# Patient Record
Sex: Female | Born: 1970
Health system: Southern US, Community
[De-identification: ages and names within clinical notes are randomized; demographics above are authoritative.]

## PROBLEM LIST (undated history)

## (undated) DIAGNOSIS — K8020 Calculus of gallbladder without cholecystitis without obstruction: Secondary | ICD-10-CM

## (undated) DIAGNOSIS — N809 Endometriosis, unspecified: Secondary | ICD-10-CM

## (undated) DIAGNOSIS — G9050 Complex regional pain syndrome I, unspecified: Secondary | ICD-10-CM

## (undated) DIAGNOSIS — T8859XA Other complications of anesthesia, initial encounter: Secondary | ICD-10-CM

## (undated) DIAGNOSIS — E785 Hyperlipidemia, unspecified: Secondary | ICD-10-CM

## (undated) DIAGNOSIS — D6861 Antiphospholipid syndrome: Secondary | ICD-10-CM

## (undated) DIAGNOSIS — E079 Disorder of thyroid, unspecified: Secondary | ICD-10-CM

## (undated) DIAGNOSIS — K589 Irritable bowel syndrome without diarrhea: Secondary | ICD-10-CM

## (undated) DIAGNOSIS — E039 Hypothyroidism, unspecified: Secondary | ICD-10-CM

## (undated) DIAGNOSIS — T4145XA Adverse effect of unspecified anesthetic, initial encounter: Secondary | ICD-10-CM

## (undated) DIAGNOSIS — N96 Recurrent pregnancy loss: Secondary | ICD-10-CM

## (undated) HISTORY — PX: ANKLE SURGERY: SHX546

## (undated) HISTORY — PX: WISDOM TOOTH EXTRACTION: SHX21

## (undated) HISTORY — PX: UPPER GI ENDOSCOPY: SHX6162

## (undated) HISTORY — PX: COLONOSCOPY: SHX174

## (undated) HISTORY — DX: Calculus of gallbladder without cholecystitis without obstruction: K80.20

## (undated) HISTORY — DX: Antiphospholipid syndrome: D68.61

---

## 1998-06-10 DIAGNOSIS — D6861 Antiphospholipid syndrome: Secondary | ICD-10-CM

## 1998-06-10 HISTORY — PX: ECTOPIC PREGNANCY SURGERY: SHX613

## 1998-06-10 HISTORY — DX: Antiphospholipid syndrome: D68.61

## 2014-06-10 DIAGNOSIS — K8020 Calculus of gallbladder without cholecystitis without obstruction: Secondary | ICD-10-CM

## 2014-06-10 HISTORY — DX: Calculus of gallbladder without cholecystitis without obstruction: K80.20

## 2015-04-19 ENCOUNTER — Emergency Department (HOSPITAL_COMMUNITY)
Admission: EM | Admit: 2015-04-19 | Discharge: 2015-04-19 | Disposition: A | Discharge status: Home / Self Care | Payer: BLUE CROSS/BLUE SHIELD | Attending: Emergency Medicine | Admitting: Emergency Medicine

## 2015-04-19 ENCOUNTER — Emergency Department (HOSPITAL_COMMUNITY): Payer: BLUE CROSS/BLUE SHIELD

## 2015-04-19 ENCOUNTER — Encounter (HOSPITAL_COMMUNITY): Payer: Self-pay

## 2015-04-19 DIAGNOSIS — K8020 Calculus of gallbladder without cholecystitis without obstruction: Secondary | ICD-10-CM | POA: Diagnosis not present

## 2015-04-19 DIAGNOSIS — Z79899 Other long term (current) drug therapy: Secondary | ICD-10-CM | POA: Insufficient documentation

## 2015-04-19 DIAGNOSIS — Z3202 Encounter for pregnancy test, result negative: Secondary | ICD-10-CM | POA: Diagnosis not present

## 2015-04-19 DIAGNOSIS — E079 Disorder of thyroid, unspecified: Secondary | ICD-10-CM | POA: Diagnosis not present

## 2015-04-19 DIAGNOSIS — R109 Unspecified abdominal pain: Secondary | ICD-10-CM | POA: Diagnosis present

## 2015-04-19 DIAGNOSIS — K5732 Diverticulitis of large intestine without perforation or abscess without bleeding: Secondary | ICD-10-CM | POA: Insufficient documentation

## 2015-04-19 HISTORY — DX: Irritable bowel syndrome, unspecified: K58.9

## 2015-04-19 HISTORY — DX: Disorder of thyroid, unspecified: E07.9

## 2015-04-19 LAB — CBC
HEMATOCRIT: 43.3 % (ref 36.0–46.0)
Hemoglobin: 14.5 g/dL (ref 12.0–15.0)
MCH: 31.6 pg (ref 26.0–34.0)
MCHC: 33.5 g/dL (ref 30.0–36.0)
MCV: 94.3 fL (ref 78.0–100.0)
PLATELETS: 215 10*3/uL (ref 150–400)
RBC: 4.59 MIL/uL (ref 3.87–5.11)
RDW: 12.6 % (ref 11.5–15.5)
WBC: 9.1 10*3/uL (ref 4.0–10.5)

## 2015-04-19 LAB — URINALYSIS, ROUTINE W REFLEX MICROSCOPIC
Glucose, UA: NEGATIVE mg/dL
HGB URINE DIPSTICK: NEGATIVE
NITRITE: NEGATIVE
PROTEIN: 30 mg/dL — AB
Specific Gravity, Urine: 1.026 (ref 1.005–1.030)
UROBILINOGEN UA: 1 mg/dL (ref 0.0–1.0)
pH: 5.5 (ref 5.0–8.0)

## 2015-04-19 LAB — COMPREHENSIVE METABOLIC PANEL
ALBUMIN: 3.7 g/dL (ref 3.5–5.0)
ALK PHOS: 54 U/L (ref 38–126)
ALT: 20 U/L (ref 14–54)
ANION GAP: 13 (ref 5–15)
AST: 20 U/L (ref 15–41)
BILIRUBIN TOTAL: 2 mg/dL — AB (ref 0.3–1.2)
BUN: 7 mg/dL (ref 6–20)
CALCIUM: 9.3 mg/dL (ref 8.9–10.3)
CO2: 26 mmol/L (ref 22–32)
Chloride: 99 mmol/L — ABNORMAL LOW (ref 101–111)
Creatinine, Ser: 0.75 mg/dL (ref 0.44–1.00)
GFR calc Af Amer: >60 mL/min (ref >60)
GLUCOSE: 119 mg/dL — AB (ref 65–99)
POTASSIUM: 3.5 mmol/L (ref 3.5–5.1)
Sodium: 138 mmol/L (ref 135–145)
TOTAL PROTEIN: 7.3 g/dL (ref 6.5–8.1)

## 2015-04-19 LAB — URINE MICROSCOPIC-ADD ON

## 2015-04-19 LAB — LIPASE, BLOOD: Lipase: 24 U/L (ref 11–51)

## 2015-04-19 LAB — POC URINE PREG, ED: PREG TEST UR: NEGATIVE

## 2015-04-19 MED ORDER — METRONIDAZOLE 500 MG PO TABS: 500.0000 mg | ORAL_TABLET | Freq: Two times a day (BID) | ORAL | Status: DC

## 2015-04-19 MED ORDER — ONDANSETRON HCL 8 MG PO TABS: 8.0000 mg | ORAL_TABLET | Freq: Three times a day (TID) | ORAL | Status: DC | PRN

## 2015-04-19 MED ORDER — CIPROFLOXACIN HCL 500 MG PO TABS: 500.0000 mg | ORAL_TABLET | Freq: Two times a day (BID) | ORAL | Status: DC

## 2015-04-19 MED ORDER — IOHEXOL 300 MG/ML  SOLN
100.0000 mL | Freq: Once | INTRAMUSCULAR | Status: AC | PRN
  Administered 2015-04-19: 100 mL via INTRAVENOUS

## 2015-04-19 NOTE — ED Notes (Signed)
Doctor at bedside.

## 2015-04-19 NOTE — ED Notes (Signed)
Pt transported to CT ?

## 2015-04-19 NOTE — ED Notes (Signed)
Generalized abdominal pain, feels like it is going to burst.  Last BM was Sunday, diarheea, pt. Denies any blood in stool Pt. Reports that alsmost always n/v.  Pt. Reported having heart palpations this morning,  She denies any chest pain.

## 2015-04-19 NOTE — ED Provider Notes (Signed)
CSN: [id number]     Arrival date & time 04/19/15  0865 History   First MD Initiated Contact with Patient 04/19/15 [phone]     Chief Complaint  Patient presents with  . Abdominal Pain     (Consider location/radiation/quality/duration/timing/severity/associated sxs/prior Treatment) HPI    Judith Long is a 44 y.o. female who presents for evaluation of abdominal pain. Pain started several days ago. She also has a sensation of bloating and inability to have a bowel movement. Her last bowel movement was 4 days ago, and was "diarrhea". She's been nauseated but not vomiting. There's been no blood in stool. She has a history of "IBS" diagnosed clinically. She has never had endoscopy. She is new in [place]. She denies fever, chills, cough, shortness of breath, weakness or dizziness. There are no other known modifying factors.    Past Medical History  Diagnosis Date  . IBS (irritable bowel syndrome)     [place] in [place]  . Thyroid disease    Past Surgical History  Procedure Laterality Date  . Ectopic pregnancy surgery    . Ankle surgery      ligaments repaired   No family history on file. Social History  Substance Use Topics  . Smoking status: Never Smoker   . Smokeless tobacco: None  . Alcohol Use: Yes     Comment: daily   OB History    No data available     Review of Systems  All other systems reviewed and are negative.     Allergies  Aspirin  Home Medications   Prior to Admission medications   Medication Sig Start Date End Date Taking? Authorizing Provider  cetirizine (ZYRTEC) 10 MG tablet Take 5 mg by mouth daily.    Yes Historical Provider, MD  Cholecalciferol (VITAMIN D) 2000 UNITS tablet Take 2,000 Units by mouth daily.   Yes Historical Provider, MD  dicyclomine (BENTYL) 10 MG capsule Take 10 mg by mouth 3 (three) times daily as needed for spasms.    Yes Historical Provider, MD  levothyroxine (SYNTHROID, LEVOTHROID) 100 MCG tablet Take 100 mcg by  mouth daily before breakfast.   Yes Historical Provider, MD  psyllium (HYDROCIL/METAMUCIL) 95 % PACK Take 1 packet by mouth daily as needed for mild constipation.    Yes Historical Provider, MD   BP 128/91 mmHg  Pulse 91  Temp(Src) 98.2 F (36.8 C) (Oral)  Resp 16  Ht  (1.549 m)  Wt 159 lb 2 oz (72.179 kg)  BMI 30.08 kg/m2  SpO2 97%  LMP 04/06/2015 Physical Exam  Constitutional: She is oriented to person, place, and time. She appears well-developed.  Overweight  HENT:  Head: Normocephalic and atraumatic.  Right Ear: External ear normal.  Left Ear: External ear normal.  Eyes: Conjunctivae and EOM are normal. Pupils are equal, round, and reactive to light.  Neck: Normal range of motion and phonation normal. Neck supple.  Cardiovascular: Normal rate, regular rhythm and normal heart sounds.   Pulmonary/Chest: Effort normal and breath sounds normal. She exhibits no bony tenderness.  Abdominal: Soft. She exhibits no mass. There is tenderness (Diffuse, mild). There is no rebound and no guarding.  Musculoskeletal: Normal range of motion.  Neurological: She is alert and oriented to person, place, and time. No cranial nerve deficit or sensory deficit. She exhibits normal muscle tone. Coordination normal.  Skin: Skin is warm, dry and intact.  Psychiatric: She has a normal mood and affect. Her behavior is normal. Judgment and thought content normal.  Nursing note and vitals reviewed.   ED Course  Procedures (including critical care time)  Medications - No data to display  Patient Vitals for the past 24 hrs:  BP Temp Temp src Pulse Resp SpO2 Height Weight  04/19/15 0930 128/91 mmHg - - 91 16 97 % - -  04/19/15 0842 160/98 mmHg 98.2 F (36.8 C) Oral 106 20 100 % 5\' 1"  (1.549 m) 159 lb 2 oz (72.179 kg)    At D/C Reevaluation with update and discussion. After initial assessment and treatment, an updated evaluation reveals she is comfortable. Discussed findings, answered questions.  Elva Breaker L    Labs Review Labs Reviewed  COMPREHENSIVE METABOLIC PANEL - Abnormal; Notable for the following:    Chloride 99 (*)    Glucose, Bld 119 (*)    Total Bilirubin 2.0 (*)    All other components within normal limits  URINALYSIS, ROUTINE W REFLEX MICROSCOPIC (NOT AT [facility]) - Abnormal; Notable for the following:    Color, Urine AMBER (*)    APPearance CLOUDY (*)    Bilirubin Urine MODERATE (*)    Ketones, ur >80 (*)    Protein, ur 30 (*)    Leukocytes, UA TRACE (*)    All other components within normal limits  URINE MICROSCOPIC-ADD ON - Abnormal; Notable for the following:    Squamous Epithelial / LPF FEW (*)    Bacteria, UA MANY (*)    All other components within normal limits  LIPASE, BLOOD  CBC  POC URINE PREG, ED    Imaging Review No results found. I have personally reviewed and evaluated these images and lab results as part of my medical decision-making.   EKG Interpretation   Date/Time:  Wednesday April 19 2015 08:58:44 EST Ventricular Rate:  97 PR Interval:  138 QRS Duration: 82 QT Interval:  352 QTC Calculation: 447 R Axis:   70 Text Interpretation:  Normal sinus rhythm Nonspecific T wave abnormality  Abnormal ECG No old tracing to compare Confirmed by [facility]  MD, Tishara Pizano  [phone] on 04/19/2015 9:56:52 AM      MDM   Final diagnoses:  Diverticulitis of large intestine without perforation or abscess without bleeding  Calculus of gallbladder without cholecystitis without obstruction    Abdominal pain d/t mild diverticulitis. Incidental gall stones. Doubt SBI, metabolic instability, or sepsis.  Nursing Notes Reviewed/ Care Coordinated Applicable Imaging Reviewed Interpretation of Laboratory Data incorporated into ED treatment  The patient appears reasonably screened and/or stabilized for discharge and I doubt any other medical condition or other [facility] requiring further screening, evaluation, or treatment in the ED at this time prior to  discharge.  Plan: Home Medications- Cipro, Flagyl, Zofran; Home Treatments- rest; return here if the recommended treatment, does not improve the symptoms; Recommended follow up- GI 2-3 weeks     Mancel BaleElliott Charnelle Bergeman, MD 04/20/15 1920

## 2015-04-19 NOTE — Discharge Instructions (Signed)
Cholelithiasis Cholelithiasis (also called gallstones) is a form of gallbladder disease in which gallstones form in your gallbladder. The gallbladder is an organ that stores bile made in the liver, which helps digest fats. Gallstones begin as small crystals and slowly grow into stones. Gallstone pain occurs when the gallbladder spasms and a gallstone is blocking the duct. Pain can also occur when a stone passes out of the duct.  RISK FACTORS Being female.  Having multiple pregnancies. Health care providers sometimes advise removing diseased gallbladders before future pregnancies.  Being obese. Eating a diet heavy in fried foods and fat.  Being older than 60 years and increasing age.  Prolonged use of medicines containing female hormones.  Having diabetes mellitus.  Rapidly losing weight.  Having a family history of gallstones (heredity).  SYMPTOMS Nausea.  Vomiting. Abdominal pain.  Yellowing of the skin (jaundice).  Sudden pain. It may persist from several minutes to several hours. Fever.  Tenderness to the touch. In some cases, when gallstones do not move into the bile duct, people have no pain or symptoms. These are called "silent" gallstones.  TREATMENT Silent gallstones do not need treatment. In severe cases, emergency surgery may be required. Options for treatment include: Surgery to remove the gallbladder. This is the most common treatment. Medicines. These do not always work and may take 6-12 months or more to work. Shock wave treatment (extracorporeal biliary lithotripsy). In this treatment an ultrasound machine sends shock waves to the gallbladder to break gallstones into smaller pieces that can pass into the intestines or be dissolved by medicine. HOME CARE INSTRUCTIONS  Only take over-the-counter or prescription medicines for pain, discomfort, or fever as directed by your health care provider.  Follow a low-fat diet until seen again by your health care provider.  Fat causes the gallbladder to contract, which can result in pain.  Follow up with your health care provider as directed. Attacks are almost always recurrent and surgery is usually required for permanent treatment.  SEEK IMMEDIATE MEDICAL CARE IF:  Your pain increases and is not controlled by medicines.  You have a fever or persistent symptoms for more than 2-3 days.  You have a fever and your symptoms suddenly get worse.  You have persistent nausea and vomiting.  MAKE SURE YOU:  Understand these instructions. Will watch your condition. Will get help right away if you are not doing well or get worse.   This information is not intended to replace advice given to you by your health care provider. Make sure you discuss any questions you have with your health care provider.   Document Released: 05/23/2005 Document Revised: 01/27/2013 Document Reviewed: 11/18/2012 Elsevier Interactive Patient Education 2016 [employer]  Diverticulitis Diverticulitis is inflammation or infection of small pouches in your colon that form when you have a condition called diverticulosis. The pouches in your colon are called diverticula. Your colon, or large intestine, is where water is absorbed and stool is formed. Complications of diverticulitis can include: Bleeding. Severe infection. Severe pain. Perforation of your colon. Obstruction of your colon. CAUSES  Diverticulitis is caused by bacteria. Diverticulitis happens when stool becomes trapped in diverticula. This allows bacteria to grow in the diverticula, which can lead to inflammation and infection. RISK FACTORS People with diverticulosis are at risk for diverticulitis. Eating a diet that does not include enough fiber from fruits and vegetables may make diverticulitis more likely to develop. SYMPTOMS  Symptoms of diverticulitis may include: Abdominal pain and tenderness. The pain is normally located  on the left side of the abdomen, but may occur  in other areas. Fever and chills. Bloating. Cramping. Nausea. Vomiting. Constipation. Diarrhea. Blood in your stool. DIAGNOSIS  Your health care provider will ask you about your medical history and do a physical exam. You may need to have tests done because many medical conditions can cause the same symptoms as diverticulitis. Tests may include: Blood tests. Urine tests. Imaging tests of the abdomen, including X-rays and CT scans. When your condition is under control, your health care provider may recommend that you have a colonoscopy. A colonoscopy can show how severe your diverticula are and whether something else is causing your symptoms. TREATMENT  Most cases of diverticulitis are mild and can be treated at home. Treatment may include: Taking over-the-counter pain medicines. Following a clear liquid diet. Taking antibiotic medicines by mouth for 7-10 days. More severe cases may be treated at a hospital. Treatment may include: Not eating or drinking. Taking prescription pain medicine. Receiving antibiotic medicines through an IV tube. Receiving fluids and nutrition through an IV tube. Surgery. HOME CARE INSTRUCTIONS  Follow your health care provider's instructions carefully. Follow a full liquid diet or other diet as directed by your health care provider. After your symptoms improve, your health care provider may tell you to change your diet. He or she may recommend you eat a high-fiber diet. Fruits and vegetables are good sources of fiber. Fiber makes it easier to pass stool. Take fiber supplements or probiotics as directed by your health care provider. Only take medicines as directed by your health care provider. Keep all your follow-up appointments. SEEK MEDICAL CARE IF:  Your pain does not improve. You have a hard time eating food. Your bowel movements do not return to normal. SEEK IMMEDIATE MEDICAL CARE IF:  Your pain becomes worse. Your symptoms do not get better. Your  symptoms suddenly get worse. You have a fever. You have repeated vomiting. You have bloody or black, tarry stools. MAKE SURE YOU:  Understand these instructions. Will watch your condition. Will get help right away if you are not doing well or get worse.   This information is not intended to replace advice given to you by your health care provider. Make sure you discuss any questions you have with your health care provider.   Document Released: 03/06/2005 Document Revised: 06/01/2013 Document Reviewed: 04/21/2013 Elsevier Interactive Patient Education [employer]

## 2015-06-07 ENCOUNTER — Ambulatory Visit: Payer: BLUE CROSS/BLUE SHIELD | Admitting: Internal Medicine

## 2016-10-04 DIAGNOSIS — R202 Paresthesia of skin: Secondary | ICD-10-CM | POA: Diagnosis not present

## 2016-10-04 DIAGNOSIS — R5383 Other fatigue: Secondary | ICD-10-CM | POA: Diagnosis not present

## 2016-10-04 DIAGNOSIS — E559 Vitamin D deficiency, unspecified: Secondary | ICD-10-CM | POA: Diagnosis not present

## 2016-10-04 DIAGNOSIS — E039 Hypothyroidism, unspecified: Secondary | ICD-10-CM | POA: Diagnosis not present

## 2016-10-09 DIAGNOSIS — K580 Irritable bowel syndrome with diarrhea: Secondary | ICD-10-CM | POA: Diagnosis not present

## 2016-10-09 DIAGNOSIS — R197 Diarrhea, unspecified: Secondary | ICD-10-CM | POA: Diagnosis not present

## 2016-10-09 DIAGNOSIS — R1084 Generalized abdominal pain: Secondary | ICD-10-CM | POA: Diagnosis not present

## 2016-10-09 DIAGNOSIS — Z800 Family history of malignant neoplasm of digestive organs: Secondary | ICD-10-CM | POA: Diagnosis not present

## 2016-10-24 DIAGNOSIS — K319 Disease of stomach and duodenum, unspecified: Secondary | ICD-10-CM | POA: Diagnosis not present

## 2016-10-24 DIAGNOSIS — R1084 Generalized abdominal pain: Secondary | ICD-10-CM | POA: Diagnosis not present

## 2016-10-24 DIAGNOSIS — K640 First degree hemorrhoids: Secondary | ICD-10-CM | POA: Diagnosis not present

## 2016-10-24 DIAGNOSIS — R197 Diarrhea, unspecified: Secondary | ICD-10-CM | POA: Diagnosis not present

## 2016-10-24 DIAGNOSIS — K3189 Other diseases of stomach and duodenum: Secondary | ICD-10-CM | POA: Diagnosis not present

## 2016-10-24 DIAGNOSIS — Z800 Family history of malignant neoplasm of digestive organs: Secondary | ICD-10-CM | POA: Diagnosis not present

## 2016-10-29 DIAGNOSIS — K319 Disease of stomach and duodenum, unspecified: Secondary | ICD-10-CM | POA: Diagnosis not present

## 2017-01-10 ENCOUNTER — Ambulatory Visit (INDEPENDENT_AMBULATORY_CARE_PROVIDER_SITE_OTHER): Payer: BLUE CROSS/BLUE SHIELD | Admitting: Obstetrics & Gynecology

## 2017-01-10 ENCOUNTER — Encounter: Payer: Self-pay | Admitting: Obstetrics & Gynecology

## 2017-01-10 ENCOUNTER — Other Ambulatory Visit (HOSPITAL_COMMUNITY)
Admission: RE | Admit: 2017-01-10 | Discharge: 2017-01-10 | Disposition: A | Discharge status: Home / Self Care | Payer: BLUE CROSS/BLUE SHIELD | Source: Ambulatory Visit | Attending: Obstetrics & Gynecology | Admitting: Obstetrics & Gynecology

## 2017-01-10 VITALS — BP 132/86 | HR 86 | Resp 14 | Ht 60.5 in | Wt 157.1 lb

## 2017-01-10 DIAGNOSIS — N938 Other specified abnormal uterine and vaginal bleeding: Secondary | ICD-10-CM | POA: Insufficient documentation

## 2017-01-10 DIAGNOSIS — K582 Mixed irritable bowel syndrome: Secondary | ICD-10-CM | POA: Diagnosis not present

## 2017-01-10 DIAGNOSIS — R102 Pelvic and perineal pain: Secondary | ICD-10-CM

## 2017-01-10 DIAGNOSIS — Z124 Encounter for screening for malignant neoplasm of cervix: Secondary | ICD-10-CM | POA: Insufficient documentation

## 2017-01-10 DIAGNOSIS — E079 Disorder of thyroid, unspecified: Secondary | ICD-10-CM | POA: Insufficient documentation

## 2017-01-10 DIAGNOSIS — R109 Unspecified abdominal pain: Secondary | ICD-10-CM | POA: Diagnosis not present

## 2017-01-10 DIAGNOSIS — Z01419 Encounter for gynecological examination (general) (routine) without abnormal findings: Secondary | ICD-10-CM | POA: Diagnosis not present

## 2017-01-10 DIAGNOSIS — N96 Recurrent pregnancy loss: Secondary | ICD-10-CM | POA: Diagnosis not present

## 2017-01-10 NOTE — Progress Notes (Signed)
46 y.o. [id number] Married Caucasian F here for annual exam.  Here for new patient appointment.  Have lived all over during the past 10 years--[place], [place].  Used to see Dr. Billy Coastaavon about 10 years ago.  Getting reestablished here.    Long hx of fertility issues.  Had 9 miscarriages.  H/O two ectopic pregnancies.  First one was treated with MTX and one with laparoscopy in [place], 2000.  Reports her progesterone levels have always been "low" and this was thought to be the cause of her miscarriages.  Took clomid off and on for several cycles.    Cycles are every 21-23 days.  Flow lasts about four days with three days being heavy.  Does have cramping and clotting occurs during these three days.    Having a lot of bloating associated with nausea and back pain.  Has seen GI this year, [facility].  Has colonoscopy and endoscopy in May.  Both were negative.  Has constipation and diarrhea, alternately.  Has been diagnosed with IBS.     Has some hormonal testing done recently.  Endocrinologist tested about five years ago.    Patient's last menstrual period was 01/01/2017.          Sexually active: Yes.    The current method of family planning is rhythm method.    Exercising: No.  The patient does not participate in regular exercise at present. Smoker:  no  Health Maintenance: Pap:  04/26/15 done with PCP  History of abnormal Pap:  no MMG:  never [facility]  [facility], IBS BMD:   never TDaP:  2015  Pneumonia vaccine(s):  never Zostavax:   never Hep C testing: not indicated  Screening Labs: PCP, Hb today: PCP   reports that she has never smoked. She has never used smokeless tobacco. She reports that she drinks alcohol. She reports that she does not use drugs.  Past Medical History:  Diagnosis Date  . IBS (irritable bowel syndrome)    [place]  . Thyroid disease     Past Surgical History:  Procedure Laterality Date  . ANKLE SURGERY     ligaments repaired  . ECTOPIC  PREGNANCY SURGERY    . WISDOM TOOTH EXTRACTION      Current Outpatient Prescriptions  Medication Sig Dispense Refill  . cetirizine (ZYRTEC) 10 MG tablet Take 5 mg by mouth as needed.     . Cholecalciferol (VITAMIN D) 2000 UNITS tablet Take 2,000 Units by mouth daily.    Marland Kitchen. dicyclomine (BENTYL) 10 MG capsule Take 10 mg by mouth 3 (three) times daily as needed for spasms.     Marland Kitchen. levothyroxine (SYNTHROID, LEVOTHROID) 100 MCG tablet Take 100 mcg by mouth daily before breakfast.    . psyllium (HYDROCIL/METAMUCIL) 95 % PACK Take 1 packet by mouth daily as needed for mild constipation.      No current facility-administered medications for this visit.     Family History  Problem Relation Age of Onset  . Addison's disease Mother   . Colon cancer Father   . Diabetes Maternal Grandmother     ROS:  Pertinent items are noted in HPI.  Otherwise, a comprehensive ROS was negative.  Exam:   BP 132/86 (BP Location: Left Arm, Patient Position: Sitting, Cuff Size: Normal)   Pulse 86   Resp 14   Ht 5' 0.5" (1.537 m)   Wt 157 lb 1.6 oz (71.3 kg)   LMP 01/01/2017   BMI 30.18 kg/m  Height: 5' 0.5" (153.7 cm)  Ht Readings from Last 3 Encounters:  01/10/17 5' 0.5" (1.537 m)  04/19/15 5\' 1"  (1.549 m)    General appearance: alert, cooperative and appears stated age Head: Normocephalic, without obvious abnormality, atraumatic Neck: no adenopathy, supple, symmetrical, trachea midline and thyroid normal to inspection and palpation Lungs: clear to auscultation bilaterally Breasts: normal appearance, no masses or tenderness Heart: regular rate and rhythm Abdomen: soft, non-tender; bowel sounds normal; no masses,  no organomegaly Extremities: extremities normal, atraumatic, no cyanosis or edema Skin: Skin color, texture, turgor normal. No rashes or lesions Lymph nodes: Cervical, supraclavicular, and axillary nodes normal. No abnormal inguinal nodes palpated Neurologic: Grossly normal   Pelvic:  External genitalia:  no lesions              Urethra:  normal appearing urethra with no masses, tenderness or lesions              Bartholins and Skenes: normal                 Vagina: normal appearing vagina with normal color and discharge, no lesions              Cervix: no lesions              Pap taken: Yes.   Bimanual Exam:  Uterus:  normal size, contour, position, consistency, mobility, non-tender              Adnexa: normal adnexa and no mass, fullness, tenderness               Rectovaginal: Confirms               Anus:  normal sphincter tone, no lesions  Chaperone was present for exam.  A:  Well Woman with normal exam Family hx of colon cancer in father, aged 44 IBS Abdominal pain H/o recurrent miscarriages and ectopic pregnancies x 2 (no living children)  P:   Mammogram guidelines reviewed.  Information given regarding  pap smear and HR HPV obtained today AMH will be obtained Will schedule PUS as well Release of lab work from Dr. Lenise ArenaMeyers and colonoscopy/encoscopy from Dr Bosie Closschooler requested today return annually or prn

## 2017-01-13 LAB — CYTOLOGY - PAP
DIAGNOSIS: NEGATIVE
HPV (WINDOPATH): NOT DETECTED

## 2017-01-13 LAB — ANTI MULLERIAN HORMONE: ANTI-MULLERIAN HORMONE (AMH): 4.66 ng/mL

## 2017-01-15 ENCOUNTER — Other Ambulatory Visit: Payer: Self-pay | Admitting: Obstetrics & Gynecology

## 2017-01-15 DIAGNOSIS — Z1231 Encounter for screening mammogram for malignant neoplasm of breast: Secondary | ICD-10-CM

## 2017-01-16 ENCOUNTER — Ambulatory Visit (INDEPENDENT_AMBULATORY_CARE_PROVIDER_SITE_OTHER): Payer: BLUE CROSS/BLUE SHIELD | Admitting: Obstetrics & Gynecology

## 2017-01-16 ENCOUNTER — Ambulatory Visit (INDEPENDENT_AMBULATORY_CARE_PROVIDER_SITE_OTHER): Payer: BLUE CROSS/BLUE SHIELD

## 2017-01-16 ENCOUNTER — Other Ambulatory Visit: Payer: Self-pay | Admitting: Obstetrics & Gynecology

## 2017-01-16 ENCOUNTER — Encounter: Payer: Self-pay | Admitting: Obstetrics & Gynecology

## 2017-01-16 VITALS — BP 122/68 | HR 76 | Resp 16 | Wt 157.0 lb

## 2017-01-16 DIAGNOSIS — N96 Recurrent pregnancy loss: Secondary | ICD-10-CM

## 2017-01-16 DIAGNOSIS — N938 Other specified abnormal uterine and vaginal bleeding: Secondary | ICD-10-CM | POA: Diagnosis not present

## 2017-01-16 DIAGNOSIS — R102 Pelvic and perineal pain: Secondary | ICD-10-CM | POA: Diagnosis not present

## 2017-01-16 DIAGNOSIS — E282 Polycystic ovarian syndrome: Secondary | ICD-10-CM

## 2017-01-16 DIAGNOSIS — R109 Unspecified abdominal pain: Secondary | ICD-10-CM

## 2017-01-16 MED ORDER — IBUPROFEN 800 MG PO TABS: 800.0000 mg | ORAL_TABLET | Freq: Three times a day (TID) | ORAL | 0 refills | Status: DC | PRN

## 2017-01-16 MED ORDER — OXYCODONE-ACETAMINOPHEN 5-325 MG PO TABS: 1.0000 | ORAL_TABLET | ORAL | 0 refills | Status: DC | PRN

## 2017-01-16 NOTE — Progress Notes (Signed)
GYNECOLOGY  VISIT   HPI: 46 y.o. [id number] [name] here for additional evaluation of abdominal pain and irregular bleeding.  She's had evaluation from Dr. Bosie ClosSchooler, GI, but is concerned about possible gyn causes of her symptoms.  She did have an AMH obtained 01/10/17.  This was 4.6 which is elevated given her age.  D/w pt possible PCOS as cause of this.  Has never been diagnosed with this despite her long hx of recurrent miscarriage as well as two ectopic pregnancies.  Ok with proceeding with some additional evaluation with blood work today.  Symptoms and diagnosis discussed.  Pt does feel like she excessive hair growth despite her hair being light in color.  GYNECOLOGIC HISTORY: Patient's last menstrual period was 01/01/2017. Contraception: rhythm method Menopausal hormone therapy: n/a  Patient Active Problem List   Diagnosis Date Noted  . History of recurrent miscarriages 01/10/2017  . DUB (dysfunctional uterine bleeding) 01/10/2017  . Irritable bowel syndrome with both constipation and diarrhea 01/10/2017    Past Medical History:  Diagnosis Date  . Antiphospholipid antibody syndrome (HCC) 2000  . Gallstones 2016   noted on CT  . IBS (irritable bowel syndrome)    [place]  . Thyroid disease     Past Surgical History:  Procedure Laterality Date  . ANKLE SURGERY     ligaments repaired  . ECTOPIC PREGNANCY SURGERY  2000  . WISDOM TOOTH EXTRACTION      MEDS:   Current Outpatient Prescriptions on File Prior to Visit  Medication Sig Dispense Refill  . cetirizine (ZYRTEC) 10 MG tablet Take 5 mg by mouth as needed.     . Cholecalciferol (VITAMIN D) 2000 UNITS tablet Take 2,000 Units by mouth daily.    Marland Kitchen. dicyclomine (BENTYL) 10 MG capsule Take 10 mg by mouth 3 (three) times daily as needed for spasms.     Marland Kitchen. levothyroxine (SYNTHROID, LEVOTHROID) 100 MCG tablet Take 100 mcg by mouth daily before breakfast.    . psyllium (HYDROCIL/METAMUCIL) 95 % PACK  Take 1 packet by mouth daily as needed for mild constipation.      No current facility-administered medications on file prior to visit.     ALLERGIES: Aspirin  Family History  Problem Relation Age of Onset  . Addison's disease Mother   . Colon cancer Father   . Diabetes Maternal Grandmother     SH:  Married, non smoker  Review of Systems  Constitutional: Negative.   Respiratory: Negative.   Cardiovascular: Negative.   Genitourinary:       Irregular bleeding  Psychiatric/Behavioral: Negative.     PHYSICAL EXAMINATION:    BP 122/68 (BP Location: Right Arm, Patient Position: Sitting, Cuff Size: Normal)   Pulse 76   Resp 16   Wt 157 lb (71.2 kg)   LMP 01/01/2017   BMI 30.16 kg/m     Physical Exam  Constitutional: She is oriented to person, place, and time. She appears well-developed and well-nourished.  Neurological: She is alert and oriented to person, place, and time.  Skin: Skin is warm and dry.  Psychiatric: She has a normal mood and affect.   Technique:  Both transabdominal and transvaginal ultrasound examinations of the pelvis were performed. Transabdominal technique was performed for global imaging of the pelvis including uterus, ovaries, adnexal regions, and pelvic cul-de-sac.  It was necessary to proceed with endovaginal exam following the abdominal ultrasound transabdominal exam to visualize the endometrium and adnexa.  Color and duplex Doppler ultrasound was utilized to  evaluate blood flow to the ovaries.    Ultrasound: Uterus:  8.6 x 5.2 x 3.9cm Endometrium:  8.63mm, possible mass noted Left ovary:  2.5 x 2.1 x 1.3cm with 1.4 x 0.6cm follicle Right ovary:  2.1 x 1.8 x 1.9cm with 8 x 8mm follicle Cul de sac:  No free fluid  SHSG:  After obtaining appropriate verbal consent from patient, the cervix was visualized using a speculum, and prepped with betadine.  A tenaculum  was not applied to the cervix.  Dilation of the cervix was not necessary. The catheter was  passed into the uterus and sterile saline introduced, with the following findings: at least two filling defects were notes, 9mm and 6mm  Findings reviewed with pt.  Due to irregular bleeding and findings with ultrasound, feel hysteroscopy with polyp resection is appropriate.  Procedure discussed with patient.  Recovery and pain management discussed.  Risks discussed including but not limited to bleeding, rare risk of transfusion, infection, 1% risk of uterine perforation with risks of fluid deficit causing cardiac arrythmia, cerebral swelling and/or need to stop procedure early.  Fluid emboli and rare risk of death discussed.  DVT/PE, rare risk of risk of bowel/bladder/ureteral/vascular injury.  Patient aware if pathology abnormal she may need additional treatment.  All questions answered.  She does understand that this may not fully resolve her irregular bleeding but due to number of polyps, this is appropriate.  Of course, could consider treatment with oral hormonal therapy or just watch conservatively.  She is interested in proceeding with intervention.  Also discussed with pt considering laproscopy given her pain and negative GI evaluation.  Additional risks of bowel, bladder, ureteral injury with laparoscopy as well as vascular injury discussed.  Pt would like to proceed with both for evaluation.  Assessment: Abdominal pain AMH 4 (elevated for age) IBS Endometrial polyps  Plan:   HbA1C obtained today Total testosterone obtained today Will proceed with planning hysteroscopy with polyp resection.  Will also proceed with laparoscopy as well.  Procedure, risks and benefits reviewed today.  ~30 minutes spent with patient >50% of time was in face to face discussion of above.

## 2017-01-18 LAB — TESTOSTERONE, FREE, DIRECT
TESTOSTERONE FREE: 1.5 pg/mL (ref 0.0–4.2)
TESTOSTERONE, TOTAL: 33.8 ng/dL

## 2017-01-18 LAB — HEMOGLOBIN A1C
Est. average glucose Bld gHb Est-mCnc: 108 mg/dL
Hgb A1c MFr Bld: 5.4 % (ref 4.8–5.6)

## 2017-01-20 ENCOUNTER — Telehealth: Payer: Self-pay | Admitting: Obstetrics & Gynecology

## 2017-01-20 NOTE — Telephone Encounter (Signed)
Placed call to patient to review benefit for recommended surgery. Left voicemail to return call.  Routing to Billie RuddySally Yeakley, RN for update

## 2017-01-22 NOTE — Telephone Encounter (Signed)
Patient returned call. Reviewed benefit information and answered all questions. Patient requested hospital benefit. Provided number to [facility]. Patient notes she would like to discuss with her spouse and insurance and will return call to notify if she is ready to schedule.  Routing to Billie RuddySally Yeakley, RN for review.

## 2017-01-23 ENCOUNTER — Ambulatory Visit
Admission: RE | Admit: 2017-01-23 | Discharge: 2017-01-23 | Disposition: A | Discharge status: Home / Self Care | Payer: BLUE CROSS/BLUE SHIELD | Source: Ambulatory Visit | Attending: Obstetrics & Gynecology | Admitting: Obstetrics & Gynecology

## 2017-01-23 DIAGNOSIS — Z1231 Encounter for screening mammogram for malignant neoplasm of breast: Secondary | ICD-10-CM | POA: Diagnosis not present

## 2017-02-03 NOTE — Telephone Encounter (Signed)
Surgery scheduled for 02-24-17 at 0730 at [facility]. Surgery instruction sheet reviewed and printed copy will be mailed with surgery center brochure.  Surgery consult with Dr Hyacinth Meeker on 02-17-17.  Routing to provider for final review. Patient agreeable to disposition. Will close encounter.

## 2017-02-03 NOTE — Telephone Encounter (Signed)
Call to patient. Surgery date options discussed. Patietn prefers 02-24-17. Will proceed with scheduling and call patient back to confirm.

## 2017-02-17 ENCOUNTER — Encounter: Payer: Self-pay | Admitting: Obstetrics & Gynecology

## 2017-02-17 ENCOUNTER — Ambulatory Visit (INDEPENDENT_AMBULATORY_CARE_PROVIDER_SITE_OTHER): Payer: BLUE CROSS/BLUE SHIELD | Admitting: Obstetrics & Gynecology

## 2017-02-17 VITALS — BP 116/62 | HR 88 | Resp 16 | Wt 159.0 lb

## 2017-02-17 DIAGNOSIS — G8929 Other chronic pain: Secondary | ICD-10-CM | POA: Diagnosis not present

## 2017-02-17 DIAGNOSIS — R1084 Generalized abdominal pain: Secondary | ICD-10-CM

## 2017-02-17 DIAGNOSIS — N9489 Other specified conditions associated with female genital organs and menstrual cycle: Secondary | ICD-10-CM | POA: Diagnosis not present

## 2017-02-17 DIAGNOSIS — D6861 Antiphospholipid syndrome: Secondary | ICD-10-CM | POA: Diagnosis not present

## 2017-02-17 DIAGNOSIS — R102 Pelvic and perineal pain unspecified side: Secondary | ICD-10-CM

## 2017-02-17 MED ORDER — IBUPROFEN 800 MG PO TABS: 800.0000 mg | ORAL_TABLET | Freq: Three times a day (TID) | ORAL | 0 refills | Status: DC | PRN

## 2017-02-17 MED ORDER — HYDROCODONE-ACETAMINOPHEN 5-325 MG PO TABS: 1.0000 | ORAL_TABLET | Freq: Four times a day (QID) | ORAL | 0 refills | Status: DC | PRN

## 2017-02-17 NOTE — Progress Notes (Signed)
46 y.o. Judith Long MarriedCaucasian female here for discussion of upcoming procedure.  Diagnostic laparoscopic with possible treatment of endometriosis as well as hysteroscopy with polyp resection, D&C discussed.    Pt has been experiencing issues with abdominal pain, associated with nausea and back pain.  She did have diverticulitis in 11/16.  Has been evaluated by Dr. Estrella Deeds at [facility].  Endoscopy with colonoscopy as negative.  Has been diagnosed with IBS once these were negative.  She just feels like there is something more than just IBS causing her symptoms.    Pt does have hx of recurrent miscarriages and two ectopic pregnancies.  First was treated with MTX and second with laparoscopy in 2000.    She's also had a change in her menstrual cycles.  They are every 21-23 days with the first three being heavy associated with clotting.  Ultrasound was done 01/16/17 measuring 8.6 x 5.2 x 3.9cm with 8.55mm endometrium and two polyp appearing lesions measuring 9mm and 6mm.      For additional evaluation/treatment of symptoms, hysteroscopy with polyp resection, laparoscopy with possible treatment of endometriosis is planned.  Procedure, risks and benefits discussed with pt.  She is aware that the laparoscopy could show no significant findings.  Recovery and pain management discussed.  Risks discussed including but not limited to bleeding, risk of transfusion, infection, 1% risk of uterine perforation with risks of fluid deficit causing cardiac arrythmia, cerebral swelling and/or need to stop procedure early.  Fluid emboli and rare risk of death discussed.  DVT/PE, rare risk of risk of bowel/bladder/ureteral/vascular injury.  Patient aware if pathology abnormal she may need additional treatment.  All questions answered.    Ob Hx:   Patient's last menstrual period was 01/28/2017.          Sexually active: Yes.   Birth control: rhythm method. Last pap: 01/10/17 negative, HR HPV negative  Last MMG: 01/24/17 BIRADS 1  negative  Tobacco: Never smoker   Past Surgical History:  Procedure Laterality Date  . ANKLE SURGERY     ligaments repaired  . ECTOPIC PREGNANCY SURGERY  2000  . WISDOM TOOTH EXTRACTION      Past Medical History:  Diagnosis Date  . Antiphospholipid antibody syndrome (HCC) 2000  . Gallstones 2016   noted on CT  . IBS (irritable bowel syndrome)    [place] in [place]  . Thyroid disease     Allergies: Aspirin  Current Outpatient Prescriptions  Medication Sig Dispense Refill  . cetirizine (ZYRTEC) 10 MG tablet Take 5 mg by mouth as needed.     . Cholecalciferol (VITAMIN D) 2000 UNITS tablet Take 2,000 Units by mouth daily.    Marland Kitchen dicyclomine (BENTYL) 10 MG capsule Take 10 mg by mouth 3 (three) times daily as needed for spasms.     Marland Kitchen ibuprofen (ADVIL,MOTRIN) 200 MG tablet Take 200 mg by mouth as needed.    Marland Kitchen levothyroxine (SYNTHROID, LEVOTHROID) 100 MCG tablet Take 100 mcg by mouth daily before breakfast.    . psyllium (HYDROCIL/METAMUCIL) 95 % PACK Take 1 packet by mouth daily as needed for mild constipation.      No current facility-administered medications for this visit.     ROS: A comprehensive review of systems was negative.  Exam:    BP 116/62 (BP Location: Right Arm, Patient Position: Sitting, Cuff Size: Normal)   Pulse 88   Resp 16   Wt 159 lb (72.1 kg)   LMP 01/28/2017   BMI 30.54 kg/m   General appearance:  alert and cooperative Head: Normocephalic, without obvious abnormality, atraumatic Neck: no adenopathy, supple, symmetrical, trachea midline and thyroid not enlarged, symmetric, no tenderness/mass/nodules Lungs: clear to auscultation bilaterally Heart: regular rate and rhythm, S1, S2 normal, no murmur, click, rub or gallop Abdomen: soft, non-tender; bowel sounds normal; no masses,  no organomegaly Extremities: extremities normal, atraumatic, no cyanosis or edema Skin: Skin color, texture, turgor normal. No rashes or lesions Lymph nodes: Cervical,  supraclavicular, and axillary nodes normal. no inguinal nodes palpated Neurologic: Grossly normal  Pelvic: External genitalia:  no lesions              Urethra: normal appearing urethra with no masses, tenderness or lesions              Bartholins and Skenes: Bartholin's, Urethra, Skene's normal                 Vagina: normal appearing vagina with normal color and discharge, no lesions              Cervix: normal appearance              Pap taken: No.        Bimanual Exam:  Uterus:  uterus is normal size, shape, consistency and nontender                                      Adnexa:    normal adnexa in size, nontender and no masses                                      Rectovaginal: Deferred                                      Anus:  normal sphincter tone, no lesions  A: Chronic abdominal pain Two endometrial masses Antiphospholipid antibody syndrome Recurrent miscarriage H/o ectopic pregnancy x 2, one treated with MTX and one with laparoscopy.    P:  Diagnostic laparoscopy with possible treatment of endometriosis, possible salpingectomy, hysteroscopy with polyp resection D&C planned.  (Pt does state today that it is ok to remove anything I think is a source of her pain and discomfort at day of surgery.) Rx for Motrin and Vidocin given. Will use perioperative Lupron Medications/Vitamins reviewed.  Pt knows needs to stop any ASA products.

## 2017-02-18 ENCOUNTER — Encounter (HOSPITAL_BASED_OUTPATIENT_CLINIC_OR_DEPARTMENT_OTHER): Payer: Self-pay | Admitting: *Deleted

## 2017-02-18 ENCOUNTER — Other Ambulatory Visit: Payer: Self-pay | Admitting: Obstetrics & Gynecology

## 2017-02-18 NOTE — Progress Notes (Signed)
[facility]CBC,serum pregnancy to be drawn this week.Npo after Mn-may take thyroid med with small amt water.

## 2017-02-19 DIAGNOSIS — K589 Irritable bowel syndrome without diarrhea: Secondary | ICD-10-CM | POA: Diagnosis not present

## 2017-02-19 DIAGNOSIS — N838 Other noninflammatory disorders of ovary, fallopian tube and broad ligament: Secondary | ICD-10-CM | POA: Diagnosis not present

## 2017-02-19 DIAGNOSIS — N840 Polyp of corpus uteri: Secondary | ICD-10-CM | POA: Diagnosis not present

## 2017-02-19 DIAGNOSIS — N938 Other specified abnormal uterine and vaginal bleeding: Secondary | ICD-10-CM | POA: Diagnosis not present

## 2017-02-19 DIAGNOSIS — E079 Disorder of thyroid, unspecified: Secondary | ICD-10-CM | POA: Diagnosis not present

## 2017-02-19 DIAGNOSIS — D6861 Antiphospholipid syndrome: Secondary | ICD-10-CM | POA: Diagnosis not present

## 2017-02-19 DIAGNOSIS — Z79899 Other long term (current) drug therapy: Secondary | ICD-10-CM | POA: Diagnosis not present

## 2017-02-19 LAB — HCG, SERUM, QUALITATIVE: PREG SERUM: NEGATIVE

## 2017-02-19 LAB — CBC
HCT: 46.4 % — ABNORMAL HIGH (ref 36.0–46.0)
Hemoglobin: 16.8 g/dL — ABNORMAL HIGH (ref 12.0–15.0)
MCH: 33.2 pg (ref 26.0–34.0)
MCHC: 36.2 g/dL — AB (ref 30.0–36.0)
MCV: 91.7 fL (ref 78.0–100.0)
Platelets: 217 10*3/uL (ref 150–400)
RBC: 5.06 MIL/uL (ref 3.87–5.11)
RDW: 12.6 % (ref 11.5–15.5)
WBC: 6.7 10*3/uL (ref 4.0–10.5)

## 2017-02-21 ENCOUNTER — Encounter: Payer: Self-pay | Admitting: Obstetrics & Gynecology

## 2017-02-21 DIAGNOSIS — R102 Pelvic and perineal pain: Principal | ICD-10-CM

## 2017-02-21 DIAGNOSIS — N9489 Other specified conditions associated with female genital organs and menstrual cycle: Secondary | ICD-10-CM | POA: Insufficient documentation

## 2017-02-21 DIAGNOSIS — R1084 Generalized abdominal pain: Secondary | ICD-10-CM | POA: Insufficient documentation

## 2017-02-21 DIAGNOSIS — D6861 Antiphospholipid syndrome: Secondary | ICD-10-CM | POA: Insufficient documentation

## 2017-02-21 DIAGNOSIS — G8929 Other chronic pain: Secondary | ICD-10-CM | POA: Insufficient documentation

## 2017-02-23 NOTE — Anesthesia Preprocedure Evaluation (Addendum)
Anesthesia Evaluation  Patient identified by MRN, date of birth, ID band Patient awake    Reviewed: Allergy & Precautions, NPO status , Patient's Chart, lab work & pertinent test results  Airway Mallampati: II  TM Distance: >3 FB Neck ROM: Full    Dental  (+) Dental Advisory Given   Pulmonary neg pulmonary ROS,    breath sounds clear to auscultation       Cardiovascular negative cardio ROS   Rhythm:Regular Rate:Normal     Neuro/Psych negative neurological ROS     GI/Hepatic negative GI ROS, Neg liver ROS,   Endo/Other  Hypothyroidism   Renal/GU negative Renal ROS     Musculoskeletal   Abdominal   Peds  Hematology negative hematology ROS (+)   Anesthesia Other Findings   Reproductive/Obstetrics                            Lab Results  Component Value Date   WBC 6.7 02/19/2017   HGB 16.8 (H) 02/19/2017   HCT 46.4 (H) 02/19/2017   MCV 91.7 02/19/2017   PLT 217 02/19/2017   Lab Results  Component Value Date   CREATININE 0.75 04/19/2015   BUN 7 04/19/2015   NA 138 04/19/2015   K 3.5 04/19/2015   CL 99 (L) 04/19/2015   CO2 26 04/19/2015    Anesthesia Physical Anesthesia Plan  ASA: II  Anesthesia Plan: General   Post-op Pain Management:    Induction: Intravenous  PONV Risk Score and Plan: 4 or greater and Ondansetron, Dexamethasone, Midazolam, Scopolamine patch - Pre-op and Treatment may vary due to age or medical condition  Airway Management Planned: Oral ETT  Additional Equipment:   Intra-op Plan:   Post-operative Plan: Extubation in OR  Informed Consent: I have reviewed the patients History and Physical, chart, labs and discussed the procedure including the risks, benefits and alternatives for the proposed anesthesia with the patient or authorized representative who has indicated his/her understanding and acceptance.   Dental advisory given  Plan Discussed with:  CRNA  Anesthesia Plan Comments:        Anesthesia Quick Evaluation

## 2017-02-24 ENCOUNTER — Encounter (HOSPITAL_BASED_OUTPATIENT_CLINIC_OR_DEPARTMENT_OTHER): Payer: Self-pay

## 2017-02-24 ENCOUNTER — Ambulatory Visit (HOSPITAL_BASED_OUTPATIENT_CLINIC_OR_DEPARTMENT_OTHER): Payer: BLUE CROSS/BLUE SHIELD | Admitting: Anesthesiology

## 2017-02-24 ENCOUNTER — Ambulatory Visit (HOSPITAL_COMMUNITY)
Admission: RE | Admit: 2017-02-24 | Discharge: 2017-02-24 | Disposition: A | Discharge status: Home / Self Care | Payer: BLUE CROSS/BLUE SHIELD | Source: Ambulatory Visit | Attending: Obstetrics & Gynecology | Admitting: Obstetrics & Gynecology

## 2017-02-24 ENCOUNTER — Encounter (HOSPITAL_BASED_OUTPATIENT_CLINIC_OR_DEPARTMENT_OTHER)
Admission: RE | Disposition: A | Discharge status: Home / Self Care | Payer: Self-pay | Source: Ambulatory Visit | Attending: Obstetrics & Gynecology

## 2017-02-24 DIAGNOSIS — D6861 Antiphospholipid syndrome: Secondary | ICD-10-CM | POA: Insufficient documentation

## 2017-02-24 DIAGNOSIS — N838 Other noninflammatory disorders of ovary, fallopian tube and broad ligament: Secondary | ICD-10-CM | POA: Insufficient documentation

## 2017-02-24 DIAGNOSIS — E079 Disorder of thyroid, unspecified: Secondary | ICD-10-CM | POA: Diagnosis not present

## 2017-02-24 DIAGNOSIS — N938 Other specified abnormal uterine and vaginal bleeding: Secondary | ICD-10-CM | POA: Insufficient documentation

## 2017-02-24 DIAGNOSIS — K589 Irritable bowel syndrome without diarrhea: Secondary | ICD-10-CM | POA: Insufficient documentation

## 2017-02-24 DIAGNOSIS — N840 Polyp of corpus uteri: Secondary | ICD-10-CM | POA: Insufficient documentation

## 2017-02-24 DIAGNOSIS — N920 Excessive and frequent menstruation with regular cycle: Secondary | ICD-10-CM | POA: Diagnosis not present

## 2017-02-24 DIAGNOSIS — Z79899 Other long term (current) drug therapy: Secondary | ICD-10-CM | POA: Diagnosis not present

## 2017-02-24 DIAGNOSIS — N809 Endometriosis, unspecified: Secondary | ICD-10-CM | POA: Diagnosis not present

## 2017-02-24 DIAGNOSIS — R102 Pelvic and perineal pain: Secondary | ICD-10-CM | POA: Diagnosis not present

## 2017-02-24 DIAGNOSIS — N803 Endometriosis of pelvic peritoneum: Secondary | ICD-10-CM | POA: Diagnosis not present

## 2017-02-24 DIAGNOSIS — N7011 Chronic salpingitis: Secondary | ICD-10-CM | POA: Diagnosis not present

## 2017-02-24 HISTORY — PX: DILATATION & CURETTAGE/HYSTEROSCOPY WITH MYOSURE: SHX6511

## 2017-02-24 HISTORY — DX: Adverse effect of unspecified anesthetic, initial encounter: T41.45XA

## 2017-02-24 HISTORY — DX: Other complications of anesthesia, initial encounter: T88.59XA

## 2017-02-24 HISTORY — PX: LAPAROSCOPY: SHX197

## 2017-02-24 SURGERY — LAPAROSCOPY, DIAGNOSTIC
Anesthesia: General | Site: Abdomen

## 2017-02-24 MED ORDER — HYDROCODONE-ACETAMINOPHEN 5-325 MG PO TABS
1.0000 | ORAL_TABLET | Freq: Four times a day (QID) | ORAL | Status: DC | PRN
  Filled 2017-02-24: qty 1

## 2017-02-24 MED ORDER — DEXAMETHASONE SODIUM PHOSPHATE 4 MG/ML IJ SOLN
INTRAMUSCULAR | Status: DC | PRN
  Administered 2017-02-24: 10 mg via INTRAVENOUS

## 2017-02-24 MED ORDER — LACTATED RINGERS IV SOLN
INTRAVENOUS | Status: DC
  Administered 2017-02-24: 1000 mL via INTRAVENOUS
  Administered 2017-02-24: 09:00:00 via INTRAVENOUS
  Filled 2017-02-24: qty 1000

## 2017-02-24 MED ORDER — DEXAMETHASONE SODIUM PHOSPHATE 10 MG/ML IJ SOLN
INTRAMUSCULAR | Status: AC
  Filled 2017-02-24: qty 1

## 2017-02-24 MED ORDER — SODIUM CHLORIDE 0.9 % IJ SOLN
INTRAMUSCULAR | Status: AC
  Filled 2017-02-24: qty 50

## 2017-02-24 MED ORDER — HYDROMORPHONE HCL-NACL 0.5-0.9 MG/ML-% IV SOSY
PREFILLED_SYRINGE | INTRAVENOUS | Status: AC
  Filled 2017-02-24: qty 1

## 2017-02-24 MED ORDER — ROPIVACAINE HCL 7.5 MG/ML IJ SOLN
INTRAMUSCULAR | Status: AC
  Filled 2017-02-24: qty 20

## 2017-02-24 MED ORDER — EPHEDRINE 5 MG/ML INJ
INTRAVENOUS | Status: AC
  Filled 2017-02-24: qty 10

## 2017-02-24 MED ORDER — MIDAZOLAM HCL 5 MG/5ML IJ SOLN
INTRAMUSCULAR | Status: DC | PRN
  Administered 2017-02-24: 2 mg via INTRAVENOUS

## 2017-02-24 MED ORDER — MENTHOL 3 MG MT LOZG
LOZENGE | OROMUCOSAL | Status: AC
  Filled 2017-02-24: qty 9

## 2017-02-24 MED ORDER — FENTANYL CITRATE (PF) 100 MCG/2ML IJ SOLN
INTRAMUSCULAR | Status: AC
  Filled 2017-02-24: qty 2

## 2017-02-24 MED ORDER — ONDANSETRON HCL 4 MG/2ML IJ SOLN
INTRAMUSCULAR | Status: AC
  Filled 2017-02-24: qty 2

## 2017-02-24 MED ORDER — SODIUM CHLORIDE 0.9 % IR SOLN
Status: DC | PRN
  Administered 2017-02-24: 6000 mL

## 2017-02-24 MED ORDER — SODIUM CHLORIDE 0.9 % IJ SOLN
INTRAMUSCULAR | Status: DC | PRN
  Administered 2017-02-24: 20 mL

## 2017-02-24 MED ORDER — SUGAMMADEX SODIUM 200 MG/2ML IV SOLN
INTRAVENOUS | Status: DC | PRN
  Administered 2017-02-24: 200 mg via INTRAVENOUS

## 2017-02-24 MED ORDER — SCOPOLAMINE 1 MG/3DAYS TD PT72
MEDICATED_PATCH | TRANSDERMAL | Status: DC | PRN
  Administered 2017-02-24: 1 via TRANSDERMAL

## 2017-02-24 MED ORDER — LIDOCAINE-EPINEPHRINE 1 %-1:100000 IJ SOLN
INTRAMUSCULAR | Status: DC | PRN
  Administered 2017-02-24: 10 mL

## 2017-02-24 MED ORDER — PROPOFOL 500 MG/50ML IV EMUL
INTRAVENOUS | Status: AC
  Filled 2017-02-24: qty 50

## 2017-02-24 MED ORDER — ENOXAPARIN SODIUM 40 MG/0.4ML ~~LOC~~ SOLN
SUBCUTANEOUS | Status: AC
  Filled 2017-02-24: qty 0.4

## 2017-02-24 MED ORDER — HYDROCODONE-ACETAMINOPHEN 5-325 MG PO TABS
ORAL_TABLET | ORAL | Status: AC
  Filled 2017-02-24: qty 1

## 2017-02-24 MED ORDER — OXYCODONE HCL 5 MG PO TABS
5.0000 mg | ORAL_TABLET | Freq: Once | ORAL | Status: AC | PRN
  Administered 2017-02-24: 5 mg via ORAL
  Filled 2017-02-24: qty 1

## 2017-02-24 MED ORDER — MIDAZOLAM HCL 2 MG/2ML IJ SOLN
INTRAMUSCULAR | Status: AC
  Filled 2017-02-24: qty 2

## 2017-02-24 MED ORDER — CEFAZOLIN SODIUM-DEXTROSE 2-4 GM/100ML-% IV SOLN
2.0000 g | INTRAVENOUS | Status: AC
  Administered 2017-02-24: 2 g via INTRAVENOUS
  Filled 2017-02-24: qty 100

## 2017-02-24 MED ORDER — SCOPOLAMINE 1 MG/3DAYS TD PT72
MEDICATED_PATCH | TRANSDERMAL | Status: AC
  Filled 2017-02-24: qty 1

## 2017-02-24 MED ORDER — FENTANYL CITRATE (PF) 100 MCG/2ML IJ SOLN
INTRAMUSCULAR | Status: DC | PRN
  Administered 2017-02-24: 100 ug via INTRAVENOUS

## 2017-02-24 MED ORDER — CEFAZOLIN SODIUM-DEXTROSE 2-4 GM/100ML-% IV SOLN
INTRAVENOUS | Status: AC
  Filled 2017-02-24: qty 100

## 2017-02-24 MED ORDER — ACETAMINOPHEN 10 MG/ML IV SOLN
INTRAVENOUS | Status: AC
  Filled 2017-02-24: qty 100

## 2017-02-24 MED ORDER — ONDANSETRON HCL 4 MG/2ML IJ SOLN
INTRAMUSCULAR | Status: DC | PRN
Start: 2017-02-24 — End: 2017-02-24
  Administered 2017-02-24: 4 mg via INTRAVENOUS

## 2017-02-24 MED ORDER — BUPIVACAINE HCL 0.25 % IJ SOLN
INTRAMUSCULAR | Status: DC | PRN
  Administered 2017-02-24: 4 mL

## 2017-02-24 MED ORDER — PROMETHAZINE HCL 25 MG/ML IJ SOLN
6.2500 mg | INTRAMUSCULAR | Status: DC | PRN
  Filled 2017-02-24: qty 1

## 2017-02-24 MED ORDER — HYDROMORPHONE HCL 1 MG/ML IJ SOLN
0.2500 mg | INTRAMUSCULAR | Status: DC | PRN
  Administered 2017-02-24 (×2): 0.25 mg via INTRAVENOUS
  Filled 2017-02-24: qty 0.5

## 2017-02-24 MED ORDER — OXYCODONE HCL 5 MG/5ML PO SOLN
5.0000 mg | Freq: Once | ORAL | Status: AC | PRN
Start: 2017-02-24 — End: 2017-02-24
  Filled 2017-02-24: qty 5

## 2017-02-24 MED ORDER — ROCURONIUM BROMIDE 10 MG/ML (PF) SYRINGE
PREFILLED_SYRINGE | INTRAVENOUS | Status: DC | PRN
  Administered 2017-02-24: 35 mg via INTRAVENOUS
  Administered 2017-02-24: 10 mg via INTRAVENOUS

## 2017-02-24 MED ORDER — LIDOCAINE 2% (20 MG/ML) 5 ML SYRINGE
INTRAMUSCULAR | Status: DC | PRN
  Administered 2017-02-24: 60 mg via INTRAVENOUS

## 2017-02-24 MED ORDER — ENOXAPARIN SODIUM 40 MG/0.4ML ~~LOC~~ SOLN
40.0000 mg | SUBCUTANEOUS | Status: AC
  Administered 2017-02-24: 40 mg via SUBCUTANEOUS
  Filled 2017-02-24: qty 0.4

## 2017-02-24 MED ORDER — SUGAMMADEX SODIUM 200 MG/2ML IV SOLN
INTRAVENOUS | Status: AC
  Filled 2017-02-24: qty 2

## 2017-02-24 MED ORDER — ACETAMINOPHEN 10 MG/ML IV SOLN
INTRAVENOUS | Status: DC | PRN
  Administered 2017-02-24: 1000 mg via INTRAVENOUS

## 2017-02-24 MED ORDER — ROCURONIUM BROMIDE 50 MG/5ML IV SOSY
PREFILLED_SYRINGE | INTRAVENOUS | Status: AC
  Filled 2017-02-24: qty 5

## 2017-02-24 MED ORDER — PROPOFOL 10 MG/ML IV BOLUS
INTRAVENOUS | Status: DC | PRN
  Administered 2017-02-24: 200 mg via INTRAVENOUS
  Administered 2017-02-24: 50 mg via INTRAVENOUS

## 2017-02-24 MED ORDER — ROPIVACAINE HCL 7.5 MG/ML IJ SOLN
INTRAMUSCULAR | Status: DC | PRN
  Administered 2017-02-24: 20 mL

## 2017-02-24 MED ORDER — OXYCODONE HCL 5 MG PO TABS
ORAL_TABLET | ORAL | Status: AC
Start: 2017-02-24 — End: 2017-02-24
  Filled 2017-02-24: qty 1

## 2017-02-24 SURGICAL SUPPLY — 84 items
APPLICATOR COTTON TIP 6IN STRL (MISCELLANEOUS) ×4 IMPLANT
BANDAGE ADHESIVE 1X3 (GAUZE/BANDAGES/DRESSINGS) IMPLANT
BENZOIN TINCTURE PRP APPL 2/3 (GAUZE/BANDAGES/DRESSINGS) IMPLANT
BIPOLAR CUTTING LOOP 21FR (ELECTRODE)
BLADE CLIPPER SURG (BLADE) IMPLANT
BLADE SURG 11 STRL SS (BLADE) ×4 IMPLANT
CABLE HIGH FREQUENCY MONO STRZ (ELECTRODE) ×4 IMPLANT
CANISTER SUCT 3000ML PPV (MISCELLANEOUS) ×8 IMPLANT
CANISTER SUCTION 1200CC (MISCELLANEOUS) IMPLANT
CATH ROBINSON RED A/P 16FR (CATHETERS) ×4 IMPLANT
CHLORAPREP W/TINT 26ML (MISCELLANEOUS) ×4 IMPLANT
CLOSURE WOUND 1/4X4 (GAUZE/BANDAGES/DRESSINGS)
CLOTH BEACON ORANGE TIMEOUT ST (SAFETY) ×4 IMPLANT
DERMABOND ADVANCED (GAUZE/BANDAGES/DRESSINGS) ×2
DERMABOND ADVANCED .7 DNX12 (GAUZE/BANDAGES/DRESSINGS) ×2 IMPLANT
DEVICE MYOSURE LITE (MISCELLANEOUS) IMPLANT
DEVICE MYOSURE REACH (MISCELLANEOUS) IMPLANT
DILATOR CANAL MILEX (MISCELLANEOUS) IMPLANT
DRAPE UNDERBUTTOCKS STRL (DRAPE) ×4 IMPLANT
DRSG COVADERM PLUS 2X2 (GAUZE/BANDAGES/DRESSINGS) ×4 IMPLANT
DRSG TELFA 3X8 NADH (GAUZE/BANDAGES/DRESSINGS) IMPLANT
ELECT REM PT RETURN 9FT ADLT (ELECTROSURGICAL) ×4
ELECTRODE REM PT RTRN 9FT ADLT (ELECTROSURGICAL) ×2 IMPLANT
FILTER ARTHROSCOPY CONVERTOR (FILTER) ×4 IMPLANT
FILTER SMOKE EVAC LAPAROSHD (FILTER) IMPLANT
GLOVE BIOGEL PI IND STRL 7.5 (GLOVE) ×12 IMPLANT
GLOVE BIOGEL PI INDICATOR 7.5 (GLOVE) ×12
GLOVE ECLIPSE 6.5 STRL STRAW (GLOVE) ×8 IMPLANT
GLOVE INDICATOR 7.0 STRL GRN (GLOVE) ×4 IMPLANT
GOWN STRL REUS W/ TWL LRG LVL3 (GOWN DISPOSABLE) ×2 IMPLANT
GOWN STRL REUS W/ TWL XL LVL3 (GOWN DISPOSABLE) IMPLANT
GOWN STRL REUS W/TWL LRG LVL3 (GOWN DISPOSABLE) ×2
GOWN STRL REUS W/TWL XL LVL3 (GOWN DISPOSABLE)
IV NS IRRIG 3000ML ARTHROMATIC (IV SOLUTION) ×8 IMPLANT
KIT RM TURNOVER CYSTO AR (KITS) ×4 IMPLANT
LOOP CUTTING BIPOLAR 21FR (ELECTRODE) IMPLANT
NEEDLE HYPO 25X1 1.5 SAFETY (NEEDLE) IMPLANT
NEEDLE INSUFFLATION 14GA 120MM (NEEDLE) ×4 IMPLANT
NEEDLE INSUFFLATION 14GA 150MM (NEEDLE) IMPLANT
NS IRRIG 500ML POUR BTL (IV SOLUTION) ×4 IMPLANT
PACK BASIN DAY SURGERY FS (CUSTOM PROCEDURE TRAY) ×4 IMPLANT
PACK LAPAROSCOPY II (CUSTOM PROCEDURE TRAY) ×4 IMPLANT
PACK TRENDGUARD 450 HYBRID PRO (MISCELLANEOUS) ×2 IMPLANT
PACK VAGINAL MINOR WOMEN LF (CUSTOM PROCEDURE TRAY) ×4 IMPLANT
PAD OB MATERNITY 4.3X12.25 (PERSONAL CARE ITEMS) ×4 IMPLANT
PAD PREP 24X48 CUFFED NSTRL (MISCELLANEOUS) ×4 IMPLANT
PADDING ION DISPOSABLE (MISCELLANEOUS) ×4 IMPLANT
POUCH SPECIMEN RETRIEVAL 10MM (ENDOMECHANICALS) IMPLANT
SCISSORS LAP 5X35 DISP (ENDOMECHANICALS) IMPLANT
SEAL ROD LENS SCOPE MYOSURE (ABLATOR) ×4 IMPLANT
SEALER TISSUE G2 CVD JAW 35 (ENDOMECHANICALS) IMPLANT
SEALER TISSUE G2 CVD JAW 45CM (ENDOMECHANICALS)
SET IRRIG TUBING LAPAROSCOPIC (IRRIGATION / IRRIGATOR) IMPLANT
SHEARS HARMONIC ACE PLUS 36CM (ENDOMECHANICALS) ×4 IMPLANT
SOLUTION ANTI FOG 6CC (MISCELLANEOUS) ×4 IMPLANT
SOLUTION ELECTROLUBE (MISCELLANEOUS) IMPLANT
STRIP CLOSURE SKIN 1/4X4 (GAUZE/BANDAGES/DRESSINGS) IMPLANT
SUT VIC AB 2-0 SH 27 (SUTURE) ×2
SUT VIC AB 2-0 SH 27XBRD (SUTURE) ×2 IMPLANT
SUT VIC AB 4-0 SH 27 (SUTURE)
SUT VIC AB 4-0 SH 27XANBCTRL (SUTURE) IMPLANT
SUT VICRYL 0 UR6 27IN ABS (SUTURE) IMPLANT
SUT VICRYL RAPIDE 4/0 PS 2 (SUTURE) ×4 IMPLANT
SYR 30ML LL (SYRINGE) IMPLANT
SYR 3ML 23GX1 SAFETY (SYRINGE) IMPLANT
SYR CONTROL 10ML LL (SYRINGE) IMPLANT
SYRINGE 10CC LL (SYRINGE) ×8 IMPLANT
TOWEL OR 17X24 6PK STRL BLUE (TOWEL DISPOSABLE) ×8 IMPLANT
TRAY DSU PREP LF (CUSTOM PROCEDURE TRAY) ×4 IMPLANT
TRAY FOLEY CATH SILVER 14FR (SET/KITS/TRAYS/PACK) ×4 IMPLANT
TRENDGUARD 450 HYBRID PRO PACK (MISCELLANEOUS) ×4
TROCAR 12M 150ML BLUNT (TROCAR) IMPLANT
TROCAR 5M 150ML BLDLS (TROCAR) IMPLANT
TROCAR BALLN 12MMX100 BLUNT (TROCAR) IMPLANT
TROCAR BLADELESS OPT 5 100 (ENDOMECHANICALS) ×4 IMPLANT
TROCAR XCEL BLUNT TIP 100MML (ENDOMECHANICALS) IMPLANT
TROCAR XCEL NON-BLD 11X100MML (ENDOMECHANICALS) IMPLANT
TROCAR XCEL NON-BLD 5MMX100MML (ENDOMECHANICALS) ×4 IMPLANT
TUBE CONNECTING 12'X1/4 (SUCTIONS)
TUBE CONNECTING 12X1/4 (SUCTIONS) IMPLANT
TUBING AQUILEX INFLOW (TUBING) ×4 IMPLANT
TUBING AQUILEX OUTFLOW (TUBING) ×4 IMPLANT
TUBING INSUF HEATED (TUBING) ×4 IMPLANT
WATER STERILE IRR 500ML POUR (IV SOLUTION) ×4 IMPLANT

## 2017-02-24 NOTE — Anesthesia Postprocedure Evaluation (Signed)
Anesthesia Post Note  Patient: Judith Long  Procedure(s) Performed: Procedure(s) (LRB): LAPAROSCOPY DIAGNOSTIC, LYSIS OF ADHESIONS, SALPINECTOMY (N/A) DILATATION & CURETTAGE/HYSTEROSCOPY (N/A)     Patient location during evaluation: PACU Anesthesia Type: General Level of consciousness: awake and alert Pain management: pain level controlled Vital Signs Assessment: post-procedure vital signs reviewed and stable Respiratory status: spontaneous breathing, nonlabored ventilation, respiratory function stable and patient connected to nasal cannula oxygen Cardiovascular status: blood pressure returned to baseline and stable Postop Assessment: no apparent nausea or vomiting Anesthetic complications: no    Last Vitals:  Vitals:   02/24/17 0915 02/24/17 0930  BP: 137/82 139/88  Pulse: 77 76  Resp: 16 17  Temp:    SpO2: 96% 95%    Last Pain:  Vitals:   02/24/17 0930  TempSrc:   PainSc: 0-No pain                 Kennieth Rad

## 2017-02-24 NOTE — Transfer of Care (Signed)
Immediate Anesthesia Transfer of Care Note  Patient: Judith Long  Procedure(s) Performed: Procedure(s): LAPAROSCOPY DIAGNOSTIC, LYSIS OF ADHESIONS, SALPINECTOMY (N/A) DILATATION & CURETTAGE/HYSTEROSCOPY (N/A)  Patient Location: PACU  Anesthesia Type:General  Level of Consciousness: awake, alert , oriented and patient cooperative  Airway & Oxygen Therapy: Patient Spontanous Breathing and Patient connected to nasal cannula oxygen  Post-op Assessment: Report given to RN and Post -op Vital signs reviewed and stable  Post vital signs: Reviewed and stable  Last Vitals:  Vitals:   02/24/17 0602  BP: (!) 141/78  Pulse: 75  Resp: 16  Temp: 36.9 C  SpO2: 98%    Last Pain:  Vitals:   02/24/17 0602  TempSrc: Oral      Patients Stated Pain Goal: 7 (02/24/17 0547)  Complications: No apparent anesthesia complications

## 2017-02-24 NOTE — Discharge Instructions (Addendum)
Post-surgical Instructions, Outpatient Surgery  You may expect to feel dizzy, weak, and drowsy for as long as 24 hours after receiving the medicine that made you sleep (anesthetic). For the first 24 hours after your surgery:    Do not drive a car, ride a bicycle, participate in physical activities, or take public transportation until you are done taking narcotic pain medicines or as directed by Dr. Hyacinth Meeker.   Do not drink alcohol or take tranquilizers.   Do not take medicine that has not been prescribed by your physicians.   Do not sign important papers or make important decisions while on narcotic pain medicines.   Have a responsible person with you.   CARE OF INCISION  If you have a bandage, you may remove it in one day.  If there are steri-strips or dermabond, just let this loosen on its own.   You may shower on the first day after your surgery.  Do not sit in a tub bath for one week.  Avoid heavy lifting (more than 10 pounds/4.5 kilograms), pushing, or pulling.   Avoid activities that may risk injury to your incisions.   PAIN MANAGEMENT  Motrin .  (This is the same as 4-200mg  over the counter tablets of Motrin or ibuprofen.)  You may take this every eight hours or as needed for cramping.     Vicodin 5/325mg .  For more severe pain, take one or two tablets every four to six hours as needed for pain control.  (Remember that narcotic pain medications increase your risk of constipation.  If this becomes a problem, you may take an over the counter stool softener like Colace  up to four times a day.)  DO'S AND DON'T'S  Do not take a tub bath for one week.  You may shower on the first day after your surgery  Do not do any heavy lifting for one to two weeks.  This increases the chance of bleeding.  Do move around as you feel able.  Stairs are fine.  You may begin to exercise again as you feel able.  Do not lift any weights for two weeks.  Do not put anything in the vagina  for two weeks--no tampons, intercourse, or douching.    REGULAR MEDIATIONS/VITAMINS:  You may restart all of your regular medications as prescribed.  You may restart all of your vitamins as you normally take them.    PLEASE CALL OR SEEK MEDICAL CARE IF:  You have persistent nausea and vomiting.   You have trouble eating or drinking.   You have an oral temperature above 100.5.   You have constipation that is not helped by adjusting diet or increasing fluid intake. Pain medicines are a common cause of constipation.   You have heavy vaginal bleeding  You have redness or drainage from your incision(s) or there is increasing pain or tenderness near or in the surgical site.    Post Anesthesia Home Care Instructions  Activity: Get plenty of rest for the remainder of the day. A responsible individual must stay with you for 24 hours following the procedure.  For the next 24 hours, DO NOT: -Drive a car -Advertising copywriter -Drink alcoholic beverages -Take any medication unless instructed by your physician -Make any legal decisions or sign important papers.  Meals: Start with liquid foods such as gelatin or soup. Progress to regular foods as tolerated. Avoid greasy, spicy, heavy foods. If nausea and/or vomiting occur, drink only clear liquids until the nausea and/or vomiting  subsides. Call your physician if vomiting continues.  Special Instructions/Symptoms: Your throat may feel dry or sore from the anesthesia or the breathing tube placed in your throat during surgery. If this causes discomfort, gargle with warm salt water. The discomfort should disappear within 24 hours.  If you had a scopolamine patch placed behind your ear for the management of post- operative nausea and/or vomiting:  1. The medication in the patch is effective for 72 hours, after which it should be removed.  Wrap patch in a tissue and discard in the trash. Wash hands thoroughly with soap and water. 2. You may remove  the patch earlier than 72 hours if you experience unpleasant side effects which may include dry mouth, dizziness or visual disturbances. 3. Avoid touching the patch. Wash your hands with soap and water after contact with the patch.

## 2017-02-24 NOTE — H&P (Signed)
Judith Long is an 46 y.o. female  G11 P0 MWF here for diagnostic laparoscopy with possible treatment of endometriosis with J plasma laser as well as hysteroscopy with polyp resection, D&C due to chronic abdominal/pelvic pain.  She has been evaluated by GI and has been diagnosed with IBS but she feels there must be some other cause of this pain.  She did have diverticulitis in 11/16 but these symptoms have fully resolved.  Has seen Dr. Bosie Clos at [facility].    She has a hx of two ectopic pregnancies, one treated with methotrexate and one treated with laparoscopy in 2000.  She is fairly sure that she has both fallopian tubes still present.  Pt has hx of antiphospholipid antibody syndrome.  She's never had a clot.  PUS 01/16/17 showed uterus measuring 8.6 x 5.2 x 3.6cm with two polyp like lesions within the endometrium.  Due to changes in bleeding as wells the above symptoms, hysteroscopy with polyp resection, D&C, laparoscopy with possible treatment of endometriosis is planned.  Risks, benefits have all been discussed.  Pt here and ready to proceed.  She did receive pre-op Lovenox.   Pertinent Gynecological History: Menses: 21-23 days wtih first three days of heavy bleeding with clotting Contraception: rhythm method DES exposure: denies Blood transfusions: none Sexually transmitted diseases: no past history Previous GYN Procedures: laparoscopy for ectopic pregnancy treatment  Last mammogram: normal Date: 8/18 Last pap: normal Date: 8/18 OB History: G11, P0   Menstrual History: Patient's last menstrual period was 02/22/2017.    Past Medical History:  Diagnosis Date  . Antiphospholipid antibody syndrome (HCC) 2000  . Complication of anesthesia    slow to wake up. disoriented  . Gallstones 2016   noted on CT  . IBS (irritable bowel syndrome)    [place]osed in [place]  . Thyroid disease     Past Surgical History:  Procedure Laterality Date  . ANKLE SURGERY     ligaments repaired,  done in [place]  . ECTOPIC PREGNANCY SURGERY  2000  . WISDOM TOOTH EXTRACTION      Family History  Problem Relation Age of Onset  . Addison's disease Mother   . Colon cancer Father   . Diabetes Maternal Grandmother     Social History:  reports that she has never smoked. She has never used smokeless tobacco. She reports that she drinks about 4.2 oz of alcohol per week . She reports that she does not use drugs.  Allergies:  Allergies  Allergen Reactions  . Toradol [Ketorolac Tromethamine] Hives  . Aspirin Rash    fever    Prescriptions Prior to Admission  Medication Sig Dispense Refill Last Dose  . levothyroxine (SYNTHROID, LEVOTHROID) 100 MCG tablet Take 100 mcg by mouth daily before breakfast.   02/23/2017 at Unknown time  . psyllium (HYDROCIL/METAMUCIL) 95 % PACK Take 1 packet by mouth daily as needed for mild constipation.    Past Month at Unknown time  . cetirizine (ZYRTEC) 10 MG tablet Take 5 mg by mouth as needed.    More than a month at Unknown time  . Cholecalciferol (VITAMIN D) 2000 UNITS tablet Take 2,000 Units by mouth daily.   02/17/2017  . dicyclomine (BENTYL) 10 MG capsule Take 10 mg by mouth 3 (three) times daily as needed for spasms.    More than a month at Unknown time  . HYDROcodone-acetaminophen (NORCO/VICODIN) 5-325 MG tablet Take 1-2 tablets by mouth every 6 (six) hours as needed for moderate pain or severe pain. 20  tablet 0   . ibuprofen (ADVIL,MOTRIN) 800 MG tablet Take 1 tablet (800 mg total) by mouth every 8 (eight) hours as needed. 30 tablet 0     Review of Systems  All other systems reviewed and are negative.   Blood pressure (!) 141/78, pulse 75, temperature 98.5 F (36.9 C), temperature source Oral, resp. rate 16, height 5' 0.5" (1.537 m), weight 160 lb (72.6 kg), last menstrual period 02/22/2017, SpO2 98 %. Physical Exam  Constitutional: She is oriented to person, place, and time. She appears well-developed and well-nourished.  Cardiovascular:  Normal rate and regular rhythm.   Respiratory: Effort normal and breath sounds normal.  GI: Soft. Bowel sounds are normal.  Neurological: She is alert and oriented to person, place, and time.  Skin: Skin is warm and dry.  Psychiatric: She has a normal mood and affect.    No results found for this or any previous visit (from the past 24 hour(s)).  No results found.  Assessment/Plan: 46 yo G11P0 MWF with pelvic/abdominal pain and two endometrial polyps here for hysteroscopy with polyp resection, D&C, diagnostic laparoscopy with possible treatment of endometriosis.  All questions answered.  Pt here and ready to proceed.  Valentina Shaggy [name] 02/24/2017, 7:00 AM

## 2017-02-24 NOTE — Op Note (Signed)
02/24/2017  9:18 AM  PATIENT:  Judith Long  46 y.o. female  PRE-OPERATIVE DIAGNOSIS:  DUB, endometrial polyps, pelvic pain  POST-OPERATIVE DIAGNOSIS:  DUB, endometrial polyps, pelvic pain, endometriosis stage I  PROCEDURE:  Procedure(s): LAPAROSCOPY DIAGNOSTIC, LYSIS OF ADHESIONS, SALPINECTOMY DILATATION & CURETTAGE/HYSTEROSCOPY  SURGEON:  Judith Long Judith Long  ASSISTANTS: Judith Exon, MD   ANESTHESIA:   general  ESTIMATED BLOOD LOSS:15cc  BLOOD ADMINISTERED:none   FLUIDS: 1000ccLR  UOP: 100  SPECIMEN:  Bilateral fallopian tubes, mesosalpinx biopsy, endometrial curettings  DISPOSITION OF SPECIMEN:  PATHOLOGY  FINDINGS: fluffy appearing endometrium with polypoid appearing but no clear polyps, area of likely endometriosis on left fallopian tube in two separate locations, right tube with hydrosalpinx, filmy adhesions to left colon and right ovary/tube.  Normal appendix.  Normal appearing liver, normal gall bladder.  DESCRIPTION OF OPERATION: Patient was taken to the operating room.  She is placed in the supine position. SCDs were on her lower extremities and functioning properly. General anesthesia without difficulty. Time out performed.    Legs were then placed in the [facility] stirrups in the low lithotomy position. The legs were lifted to the high lithotomy position and the abdomen, inner thighs perineum and vagina x3. Patient was draped in a normal standard fashion. Foley was placed to SD.  Clear urine was noted. A bivalve speculum was placed the vagina. The anterior lip of the cervix was grasped with single-tooth tenaculum.  A paracervical block of 1% lidocaine mixed one-to-one with epinephrine (1:100,000 units).  10 cc was used total. The cervix is dilated up to #21 [facility] dilators. The endometrial cavity sounded to 8 cm.   A 2.9 millimeter diagnostic hysteroscope was obtained. Normal saline was used as a hysteroscopic fluid. The hysteroscope was advanced through the  endocervical canal into the endometrial cavity. The tubal ostia were noted bilaterally. Additional findings included polypoid appearing tissue but no clear endometrial polyps.  The hysteroscope was removed. A #1 toothed curette was used to curette the cavity until rough gritty texture was noted in all quadrants. With revisualization of the hysteroscope, there was no longer any abnormal findings. At this point, this portion of the procedure was ended.  The hysteroscope was removed. The fluid deficit was 100 cc NS.  A Hulka clamp was passed into the endometrium and attached to the anterior lip of the cervix.  The tenaculum was removed from the anterior lip of the cervix as well.  The speculum was removed from the vagina.   Sterile gown and gloves were changes.  Attention was turned to the abdomen. Location for LUQ entry marked.  Skin anesthetized with .025% Marcaine. The skin was incised with a #11 blade.   Using 5mm Optiview trochar and port, this was passed into the abdomen without difficulty.  OG tube had been placed by anesthesia to decompress the stomach.  Low flow CO2 gas was attached and the pneumoperitoneum was achieved without difficulty. Gas changed to high flow.  The laparoscope was then used to confirm intraperitoneal placement. Port site locations for the RLQ and LLQ ports were chosen. The skin was transilluminated and the skin was anesthetized with ropivacaine mixture. 39mm/8mm skin incisions were made.  5mm nondisposable trocar and port was passed into the RLQ and an 8mm trochar and port was passed into the LLQ.  Pelvis was surveyed.  Upper abdomen appeared normal.  Appendix appeared normal.  Pt was placed in Trendelenburg position. Ureters were identified.  Right tube has filmy adhesions around it and the right  ovary.  Left ovary had at least three areas of likely endometriosis.  Left ovary had small amounts of filmy adhesions.  Left colon was adhered to side wall and pulling on the colon present with  Trendelenburg positioning.  These adhesions were taken down with the harmonic scalpel.  Both tubes were excised including all endometriosis appearing areas with the harmonic scalpel.  The adhesions with both ovaries were also taken down with the Harmonic scalpel.  The pelvic was then irrigated.  Small amount of bleeding was noted on the right ovary and made hemostatic with monopolar cautery.  Pressure was decreased to ensure there was no additional bleeding.  Excellent hemostasis was noted.  Then the right tube was wrapped in interceed.    The laparoscopic portion of the procedure was then completed. Pneumoperitoneum was relieved. Ports removed. Patient was taken out of Trendelenburg.  Incisions were then closed with #4.0 Vicryl and then dermabond was placed across the incision.    Attention was then turned vaginally.  Hulka clamp and foley was removed.  Sponge, lap, needle, and instrument counts were correct x2. Patient tolerated the procedure very well.  COUNTS:  YES  PLAN OF CARE: Transfer to PACU

## 2017-02-24 NOTE — Anesthesia Procedure Notes (Signed)
Procedure Name: Intubation Date/Time: 02/24/2017 7:26 AM Performed by: Wanita Chamberlain Pre-anesthesia Checklist: Patient identified, Timeout performed, Emergency Drugs available, Suction available and Patient being monitored Patient Re-evaluated:Patient Re-evaluated prior to induction Oxygen Delivery Method: Circle system utilized Preoxygenation: Pre-oxygenation with 100% oxygen Induction Type: IV induction Ventilation: Mask ventilation without difficulty Laryngoscope Size: Mac and 3 Grade View: Grade I Tube type: Oral Tube size: 7.0 mm Number of attempts: 1 Airway Equipment and Method: Stylet Placement Confirmation: ETT inserted through vocal cords under direct vision,  positive ETCO2 and breath sounds checked- equal and bilateral Secured at: 19 cm Tube secured with: Tape Dental Injury: Teeth and Oropharynx as per pre-operative assessment

## 2017-02-25 ENCOUNTER — Encounter (HOSPITAL_BASED_OUTPATIENT_CLINIC_OR_DEPARTMENT_OTHER): Payer: Self-pay | Admitting: Obstetrics & Gynecology

## 2017-03-03 ENCOUNTER — Telehealth: Payer: Self-pay | Admitting: Obstetrics & Gynecology

## 2017-03-03 NOTE — Telephone Encounter (Signed)
Pathology report from 02/25/2080 to Dr.Miller for review.

## 2017-03-03 NOTE — Telephone Encounter (Signed)
Patient states she had surgery a week ago and is wanting to see if her pathology results are back.   Please advise.

## 2017-03-05 NOTE — Telephone Encounter (Signed)
Spoke with patient. Patient verbalizes understanding of results and message from Dr.Miller. Patient states Dr.Miller mentioned moving her post-op appointment out 4-6 weeks until after her next menses. Advised to keep appointment for 03/10/2017. Will review and return call. Patient is agreeable.  Spoke with Billie Ruddy, RN recommends to move pot op out 4-6 weeks.  Call to patient. Post op moved to 04/07/2017 at 3 pm with Dr.Miller. Patient verbalizes understanding. Aware to contact the office if she does not have bleeding prior to appointment.  Routing to provider for final review.

## 2017-03-05 NOTE — Telephone Encounter (Signed)
Please let pt know pathology was negative.  No endometriosis reported.  I have asked pathology to re-look at this.  Have not received any amended report yet.  Endometrial polyps were benign.  I will review all of this with her at her follow-up appt.

## 2017-03-05 NOTE — Telephone Encounter (Signed)
Patient returning your call.

## 2017-03-05 NOTE — Telephone Encounter (Signed)
Left message to call Kaitlyn at [phone]. 

## 2017-03-06 NOTE — Telephone Encounter (Signed)
This is fine.  Thanks.  Ok to close encounter. 

## 2017-03-11 ENCOUNTER — Ambulatory Visit: Payer: BLUE CROSS/BLUE SHIELD | Admitting: Obstetrics & Gynecology

## 2017-04-07 ENCOUNTER — Encounter: Payer: Self-pay | Admitting: Obstetrics & Gynecology

## 2017-04-07 ENCOUNTER — Ambulatory Visit (INDEPENDENT_AMBULATORY_CARE_PROVIDER_SITE_OTHER): Payer: BLUE CROSS/BLUE SHIELD | Admitting: Obstetrics & Gynecology

## 2017-04-07 VITALS — BP 124/76 | HR 74 | Resp 12 | Wt 160.0 lb

## 2017-04-07 DIAGNOSIS — R102 Pelvic and perineal pain: Secondary | ICD-10-CM | POA: Diagnosis not present

## 2017-04-07 DIAGNOSIS — D6861 Antiphospholipid syndrome: Secondary | ICD-10-CM | POA: Diagnosis not present

## 2017-04-07 DIAGNOSIS — N840 Polyp of corpus uteri: Secondary | ICD-10-CM

## 2017-04-07 DIAGNOSIS — G8929 Other chronic pain: Secondary | ICD-10-CM | POA: Diagnosis not present

## 2017-04-07 DIAGNOSIS — K582 Mixed irritable bowel syndrome: Secondary | ICD-10-CM

## 2017-04-07 DIAGNOSIS — N938 Other specified abnormal uterine and vaginal bleeding: Secondary | ICD-10-CM | POA: Diagnosis not present

## 2017-04-07 MED ORDER — NORETHINDRONE 0.35 MG PO TABS: 1.0000 | ORAL_TABLET | Freq: Every day | ORAL | 3 refills | Status: DC

## 2017-04-07 NOTE — Progress Notes (Signed)
GYNECOLOGY  VISIT  CC:   Follow up after surgery and to discuss plan  HPI: 46 y.o. [id number] [name] here for discussion of progress since procedure.  She underwent laparoscopy with lysis of adhesions, bilateral salpingectomy, D&C and polyp removal.  Reports left sided pain is completed resolved.  Also, feels fatigue is much better.  Still has IBS and has associated GI issues but this seems better, too, since the procedure.  Cycle was much better this past much.  Was very light and required only occasional tampon use and mini-pads.    Pathology findings reviewed.  She and I discussed options for treatment.  Given improvement in bleeding, oral progesterone would likely benefit her.  She did use progesterone with fertility treatment in the past and had significant mood change.  Differences in dosages discussed.  She is willing to try.  GYNECOLOGIC HISTORY: Patient's last menstrual period was 03/19/2017. Contraception: bilateral salpingectomy Menopausal hormone therapy: none  Patient Active Problem List   Diagnosis Date Noted  . Chronic pelvic pain in female 02/21/2017  . Endometrial mass 02/21/2017  . Generalized abdominal pain 02/21/2017  . Antiphospholipid antibody syndrome (HCC) 02/21/2017  . History of recurrent miscarriages 01/10/2017  . DUB (dysfunctional uterine bleeding) 01/10/2017  . Irritable bowel syndrome with both constipation and diarrhea 01/10/2017    Past Medical History:  Diagnosis Date  . Antiphospholipid antibody syndrome (HCC) 2000  . Complication of anesthesia    slow to wake up. disoriented  . Gallstones 2016   noted on CT  . IBS (irritable bowel syndrome)    [place] in [place]  . Thyroid disease     Past Surgical History:  Procedure Laterality Date  . ANKLE SURGERY     ligaments repaired, done in [place]  . DILATATION & CURETTAGE/HYSTEROSCOPY WITH MYOSURE N/A 02/24/2017   Procedure: DILATATION & CURETTAGE/HYSTEROSCOPY;  Surgeon:  Jerene Bears, MD;  Location: [facility];  Service: Gynecology;  Laterality: N/A;  . ECTOPIC PREGNANCY SURGERY  2000  . LAPAROSCOPY N/A 02/24/2017   Procedure: LAPAROSCOPY DIAGNOSTIC, LYSIS OF ADHESIONS, SALPINECTOMY;  Surgeon: Jerene Bears, MD;  Location: [facility];  Service: Gynecology;  Laterality: N/A;  . WISDOM TOOTH EXTRACTION      MEDS:   Current Outpatient Prescriptions on File Prior to Visit  Medication Sig Dispense Refill  . cetirizine (ZYRTEC) 10 MG tablet Take 5 mg by mouth as needed.     . Cholecalciferol (VITAMIN D) 2000 UNITS tablet Take 2,000 Units by mouth daily.    Marland Kitchen dicyclomine (BENTYL) 10 MG capsule Take 10 mg by mouth 3 (three) times daily as needed for spasms.     Marland Kitchen levothyroxine (SYNTHROID, LEVOTHROID) 100 MCG tablet Take 100 mcg by mouth daily before breakfast.    . psyllium (HYDROCIL/METAMUCIL) 95 % PACK Take 1 packet by mouth daily as needed for mild constipation.      No current facility-administered medications on file prior to visit.     ALLERGIES: Toradol [ketorolac tromethamine] and Aspirin  Family History  Problem Relation Age of Onset  . Addison's disease Mother   . Colon cancer Father   . Diabetes Maternal Grandmother     SH:  Married, non smoker  Review of Systems  All other systems reviewed and are negative.   PHYSICAL EXAMINATION:    BP 124/76 (BP Location: Right Arm, Patient Position: Sitting, Cuff Size: Normal)   Pulse 74   Resp 12   Wt 160 lb (72.6 kg)  LMP 03/19/2017   BMI 30.73 kg/m     General appearance: alert, cooperative and appears stated age Abdomen: soft, non-tender; bowel sounds normal; no masses,  no organomegaly Inc:  C/D/I  Pelvic: no pelvic exam performed  Chaperone was present for exam.  Assessment: H/O menorrhagia with irregular bleeding Pelvic/abdominal pain that is much improved  Plan: Will try micronor.  Rx to pharmacy.  Plan follow up 3 months.   ~15 minutes spent  with patient >50% of time was in face to face discussion of above.

## 2017-05-05 ENCOUNTER — Telehealth: Payer: Self-pay | Admitting: Obstetrics & Gynecology

## 2017-05-05 NOTE — Telephone Encounter (Signed)
Spoke with patient. Patient states that when she started taking Norethindrone she began having sore breasts, increased BP, headaches, and blurred vision. Started her menses on day 19 of the pack. Stopped taking Norethindrone and all side effects resolves. Does not desire alternative treatment for endometriosis such as an IUD or Depo. Patient would like to monitor and return call if she would like an alternative. Advised will notify Dr.Miller and return call if she has any additional recommendations. Patient is agreeable.  Routing to provider for final review. Patient agreeable to disposition. Will close encounter.

## 2017-05-05 NOTE — Telephone Encounter (Signed)
Patient was taking norethindrone and has decided to stop taking it due to side effects she began having.  It spiked her blood pressure and gave her headaches and blurred vision.  She also started her period 19 days into the pack. She just just wanted to make you aware of this.

## 2017-05-13 ENCOUNTER — Telehealth: Payer: Self-pay | Admitting: Obstetrics & Gynecology

## 2017-05-13 NOTE — Telephone Encounter (Signed)
Spoke with patient. Patient states that she took Micronor for 2 weeks. Stopped taking it before Thanksgiving due to having sore breasts, increased BP, headaches, and blurred vision. States that she is still having intermittent episodes of blurred vision. Had an eye exam in November. Denies any headaches or abnormal BP since stopping POP. Has not started any new medications. Advised she will need to be seen for further evaluation with her eye doctor due to ongoing symptoms. Patient is agreeable. Advised will review with Dr.Miller and return call with any addtional recommendations.

## 2017-05-13 NOTE — Telephone Encounter (Signed)
Yes, I agree with having an eye exam.  Thanks.  Ok to close encounter.

## 2017-05-13 NOTE — Telephone Encounter (Signed)
Patient is still having blurry vision after stopping birth control. Patient said she stopped taking this birth control before thanksgiving and only took for two weeks.

## 2017-12-17 DIAGNOSIS — E782 Mixed hyperlipidemia: Secondary | ICD-10-CM | POA: Diagnosis not present

## 2017-12-17 DIAGNOSIS — R7309 Other abnormal glucose: Secondary | ICD-10-CM | POA: Diagnosis not present

## 2017-12-17 DIAGNOSIS — Z0000 Encounter for general adult medical examination without abnormal findings: Secondary | ICD-10-CM | POA: Diagnosis not present

## 2017-12-17 DIAGNOSIS — E559 Vitamin D deficiency, unspecified: Secondary | ICD-10-CM | POA: Diagnosis not present

## 2017-12-17 DIAGNOSIS — E039 Hypothyroidism, unspecified: Secondary | ICD-10-CM | POA: Diagnosis not present

## 2018-02-18 DIAGNOSIS — R635 Abnormal weight gain: Secondary | ICD-10-CM | POA: Diagnosis not present

## 2018-02-18 DIAGNOSIS — E7800 Pure hypercholesterolemia, unspecified: Secondary | ICD-10-CM | POA: Diagnosis not present

## 2018-02-18 DIAGNOSIS — R7309 Other abnormal glucose: Secondary | ICD-10-CM | POA: Diagnosis not present

## 2018-02-18 DIAGNOSIS — Z79899 Other long term (current) drug therapy: Secondary | ICD-10-CM | POA: Diagnosis not present

## 2018-03-04 DIAGNOSIS — E039 Hypothyroidism, unspecified: Secondary | ICD-10-CM | POA: Diagnosis not present

## 2018-03-09 DIAGNOSIS — R635 Abnormal weight gain: Secondary | ICD-10-CM | POA: Diagnosis not present

## 2018-03-09 DIAGNOSIS — R232 Flushing: Secondary | ICD-10-CM | POA: Diagnosis not present

## 2018-04-06 DIAGNOSIS — D6861 Antiphospholipid syndrome: Secondary | ICD-10-CM | POA: Diagnosis not present

## 2018-04-06 DIAGNOSIS — E039 Hypothyroidism, unspecified: Secondary | ICD-10-CM | POA: Diagnosis not present

## 2018-04-15 DIAGNOSIS — R635 Abnormal weight gain: Secondary | ICD-10-CM | POA: Diagnosis not present

## 2018-04-15 DIAGNOSIS — D6861 Antiphospholipid syndrome: Secondary | ICD-10-CM | POA: Diagnosis not present

## 2018-04-15 DIAGNOSIS — N96 Recurrent pregnancy loss: Secondary | ICD-10-CM | POA: Diagnosis not present

## 2018-04-15 DIAGNOSIS — E039 Hypothyroidism, unspecified: Secondary | ICD-10-CM | POA: Diagnosis not present

## 2018-05-19 DIAGNOSIS — Z79899 Other long term (current) drug therapy: Secondary | ICD-10-CM | POA: Diagnosis not present

## 2018-05-19 DIAGNOSIS — E782 Mixed hyperlipidemia: Secondary | ICD-10-CM | POA: Diagnosis not present

## 2018-05-19 DIAGNOSIS — E559 Vitamin D deficiency, unspecified: Secondary | ICD-10-CM | POA: Diagnosis not present

## 2018-08-18 DIAGNOSIS — R945 Abnormal results of liver function studies: Secondary | ICD-10-CM | POA: Diagnosis not present

## 2018-12-29 DIAGNOSIS — M79645 Pain in left finger(s): Secondary | ICD-10-CM | POA: Diagnosis not present

## 2018-12-29 DIAGNOSIS — E782 Mixed hyperlipidemia: Secondary | ICD-10-CM | POA: Diagnosis not present

## 2018-12-29 DIAGNOSIS — E039 Hypothyroidism, unspecified: Secondary | ICD-10-CM | POA: Diagnosis not present

## 2018-12-29 DIAGNOSIS — Z79899 Other long term (current) drug therapy: Secondary | ICD-10-CM | POA: Diagnosis not present

## 2019-01-08 DIAGNOSIS — M79645 Pain in left finger(s): Secondary | ICD-10-CM | POA: Diagnosis not present

## 2019-01-08 DIAGNOSIS — M1812 Unilateral primary osteoarthritis of first carpometacarpal joint, left hand: Secondary | ICD-10-CM | POA: Diagnosis not present

## 2019-02-23 DIAGNOSIS — M1812 Unilateral primary osteoarthritis of first carpometacarpal joint, left hand: Secondary | ICD-10-CM | POA: Diagnosis not present

## 2019-03-30 DIAGNOSIS — E039 Hypothyroidism, unspecified: Secondary | ICD-10-CM | POA: Diagnosis not present

## 2019-03-30 DIAGNOSIS — Z79899 Other long term (current) drug therapy: Secondary | ICD-10-CM | POA: Diagnosis not present

## 2019-06-30 DIAGNOSIS — E7800 Pure hypercholesterolemia, unspecified: Secondary | ICD-10-CM | POA: Diagnosis not present

## 2019-06-30 DIAGNOSIS — Z79899 Other long term (current) drug therapy: Secondary | ICD-10-CM | POA: Diagnosis not present

## 2019-11-22 ENCOUNTER — Observation Stay (HOSPITAL_COMMUNITY)
Admission: EM | Admit: 2019-11-22 | Discharge: 2019-11-24 | Disposition: A | Discharge status: Home / Self Care | Payer: BC Managed Care – PPO | Attending: Family Medicine | Admitting: Family Medicine

## 2019-11-22 ENCOUNTER — Encounter (HOSPITAL_COMMUNITY): Payer: Self-pay | Admitting: Emergency Medicine

## 2019-11-22 ENCOUNTER — Other Ambulatory Visit: Payer: Self-pay

## 2019-11-22 DIAGNOSIS — R7989 Other specified abnormal findings of blood chemistry: Secondary | ICD-10-CM

## 2019-11-22 DIAGNOSIS — Z20822 Contact with and (suspected) exposure to covid-19: Secondary | ICD-10-CM | POA: Insufficient documentation

## 2019-11-22 DIAGNOSIS — E669 Obesity, unspecified: Secondary | ICD-10-CM | POA: Diagnosis present

## 2019-11-22 DIAGNOSIS — Z800 Family history of malignant neoplasm of digestive organs: Secondary | ICD-10-CM | POA: Diagnosis not present

## 2019-11-22 DIAGNOSIS — D6861 Antiphospholipid syndrome: Secondary | ICD-10-CM | POA: Insufficient documentation

## 2019-11-22 DIAGNOSIS — R945 Abnormal results of liver function studies: Secondary | ICD-10-CM | POA: Diagnosis not present

## 2019-11-22 DIAGNOSIS — K828 Other specified diseases of gallbladder: Secondary | ICD-10-CM | POA: Insufficient documentation

## 2019-11-22 DIAGNOSIS — R109 Unspecified abdominal pain: Secondary | ICD-10-CM

## 2019-11-22 DIAGNOSIS — R1011 Right upper quadrant pain: Secondary | ICD-10-CM | POA: Diagnosis not present

## 2019-11-22 DIAGNOSIS — Z8379 Family history of other diseases of the digestive system: Secondary | ICD-10-CM | POA: Insufficient documentation

## 2019-11-22 DIAGNOSIS — K8050 Calculus of bile duct without cholangitis or cholecystitis without obstruction: Secondary | ICD-10-CM | POA: Diagnosis present

## 2019-11-22 DIAGNOSIS — K760 Fatty (change of) liver, not elsewhere classified: Secondary | ICD-10-CM | POA: Diagnosis not present

## 2019-11-22 DIAGNOSIS — E785 Hyperlipidemia, unspecified: Secondary | ICD-10-CM | POA: Insufficient documentation

## 2019-11-22 DIAGNOSIS — Z6831 Body mass index (BMI) 31.0-31.9, adult: Secondary | ICD-10-CM | POA: Diagnosis not present

## 2019-11-22 DIAGNOSIS — Z79899 Other long term (current) drug therapy: Secondary | ICD-10-CM | POA: Diagnosis not present

## 2019-11-22 DIAGNOSIS — E039 Hypothyroidism, unspecified: Secondary | ICD-10-CM | POA: Diagnosis present

## 2019-11-22 DIAGNOSIS — K582 Mixed irritable bowel syndrome: Secondary | ICD-10-CM | POA: Diagnosis present

## 2019-11-22 DIAGNOSIS — G9050 Complex regional pain syndrome I, unspecified: Secondary | ICD-10-CM | POA: Diagnosis not present

## 2019-11-22 DIAGNOSIS — K8010 Calculus of gallbladder with chronic cholecystitis without obstruction: Secondary | ICD-10-CM | POA: Diagnosis not present

## 2019-11-22 DIAGNOSIS — K8020 Calculus of gallbladder without cholecystitis without obstruction: Secondary | ICD-10-CM

## 2019-11-22 DIAGNOSIS — K581 Irritable bowel syndrome with constipation: Secondary | ICD-10-CM | POA: Diagnosis not present

## 2019-11-22 DIAGNOSIS — K580 Irritable bowel syndrome with diarrhea: Secondary | ICD-10-CM | POA: Diagnosis not present

## 2019-11-22 DIAGNOSIS — K8071 Calculus of gallbladder and bile duct without cholecystitis with obstruction: Secondary | ICD-10-CM | POA: Diagnosis not present

## 2019-11-22 DIAGNOSIS — Z79890 Hormone replacement therapy (postmenopausal): Secondary | ICD-10-CM | POA: Diagnosis not present

## 2019-11-22 DIAGNOSIS — K8021 Calculus of gallbladder without cholecystitis with obstruction: Secondary | ICD-10-CM

## 2019-11-22 HISTORY — DX: Endometriosis, unspecified: N80.9

## 2019-11-22 HISTORY — DX: Hypothyroidism, unspecified: E03.9

## 2019-11-22 HISTORY — DX: Complex regional pain syndrome I, unspecified: G90.50

## 2019-11-22 LAB — CBC
HCT: 47.3 % — ABNORMAL HIGH (ref 36.0–46.0)
Hemoglobin: 16.1 g/dL — ABNORMAL HIGH (ref 12.0–15.0)
MCH: 32.3 pg (ref 26.0–34.0)
MCHC: 34 g/dL (ref 30.0–36.0)
MCV: 95 fL (ref 80.0–100.0)
Platelets: 174 10*3/uL (ref 150–400)
RBC: 4.98 MIL/uL (ref 3.87–5.11)
RDW: 12.1 % (ref 11.5–15.5)
WBC: 9.1 10*3/uL (ref 4.0–10.5)
nRBC: 0 % (ref 0.0–0.2)

## 2019-11-22 LAB — COMPREHENSIVE METABOLIC PANEL
ALT: 478 U/L — ABNORMAL HIGH (ref 0–44)
AST: 364 U/L — ABNORMAL HIGH (ref 15–41)
Albumin: 4.2 g/dL (ref 3.5–5.0)
Alkaline Phosphatase: 85 U/L (ref 38–126)
Anion gap: 13 (ref 5–15)
BUN: 6 mg/dL (ref 6–20)
CO2: 23 mmol/L (ref 22–32)
Calcium: 9.9 mg/dL (ref 8.9–10.3)
Chloride: 103 mmol/L (ref 98–111)
Creatinine, Ser: 0.76 mg/dL (ref 0.44–1.00)
GFR calc Af Amer: >60 mL/min (ref >60)
GFR calc non Af Amer: >60 mL/min (ref >60)
Glucose, Bld: 108 mg/dL — ABNORMAL HIGH (ref 70–99)
Potassium: 4.3 mmol/L (ref 3.5–5.1)
Sodium: 139 mmol/L (ref 135–145)
Total Bilirubin: 4.4 mg/dL — ABNORMAL HIGH (ref 0.3–1.2)
Total Protein: 7.4 g/dL (ref 6.5–8.1)

## 2019-11-22 LAB — LIPASE, BLOOD: Lipase: 47 U/L (ref 11–51)

## 2019-11-22 MED ORDER — SODIUM CHLORIDE 0.9% FLUSH: 3.0000 mL | Freq: Once | INTRAVENOUS | Status: DC

## 2019-11-22 NOTE — ED Triage Notes (Signed)
Pt c/o RUQ pain and nausea that started on Saturday. Hx gallstones.

## 2019-11-23 ENCOUNTER — Emergency Department (HOSPITAL_COMMUNITY): Payer: BC Managed Care – PPO

## 2019-11-23 ENCOUNTER — Encounter (HOSPITAL_COMMUNITY): Payer: Self-pay | Admitting: Student

## 2019-11-23 DIAGNOSIS — Z79899 Other long term (current) drug therapy: Secondary | ICD-10-CM | POA: Diagnosis not present

## 2019-11-23 DIAGNOSIS — Z20822 Contact with and (suspected) exposure to covid-19: Secondary | ICD-10-CM | POA: Diagnosis not present

## 2019-11-23 DIAGNOSIS — K8010 Calculus of gallbladder with chronic cholecystitis without obstruction: Secondary | ICD-10-CM | POA: Diagnosis not present

## 2019-11-23 DIAGNOSIS — D6861 Antiphospholipid syndrome: Secondary | ICD-10-CM | POA: Diagnosis not present

## 2019-11-23 DIAGNOSIS — R945 Abnormal results of liver function studies: Secondary | ICD-10-CM | POA: Diagnosis not present

## 2019-11-23 DIAGNOSIS — K8050 Calculus of bile duct without cholangitis or cholecystitis without obstruction: Secondary | ICD-10-CM | POA: Diagnosis not present

## 2019-11-23 DIAGNOSIS — K580 Irritable bowel syndrome with diarrhea: Secondary | ICD-10-CM | POA: Diagnosis not present

## 2019-11-23 DIAGNOSIS — K8000 Calculus of gallbladder with acute cholecystitis without obstruction: Secondary | ICD-10-CM | POA: Diagnosis not present

## 2019-11-23 DIAGNOSIS — K828 Other specified diseases of gallbladder: Secondary | ICD-10-CM | POA: Diagnosis not present

## 2019-11-23 DIAGNOSIS — E039 Hypothyroidism, unspecified: Secondary | ICD-10-CM | POA: Diagnosis present

## 2019-11-23 DIAGNOSIS — G9050 Complex regional pain syndrome I, unspecified: Secondary | ICD-10-CM | POA: Diagnosis not present

## 2019-11-23 DIAGNOSIS — K8020 Calculus of gallbladder without cholecystitis without obstruction: Secondary | ICD-10-CM | POA: Diagnosis not present

## 2019-11-23 DIAGNOSIS — K581 Irritable bowel syndrome with constipation: Secondary | ICD-10-CM | POA: Diagnosis not present

## 2019-11-23 DIAGNOSIS — E669 Obesity, unspecified: Secondary | ICD-10-CM | POA: Diagnosis not present

## 2019-11-23 DIAGNOSIS — R935 Abnormal findings on diagnostic imaging of other abdominal regions, including retroperitoneum: Secondary | ICD-10-CM | POA: Diagnosis not present

## 2019-11-23 DIAGNOSIS — E785 Hyperlipidemia, unspecified: Secondary | ICD-10-CM | POA: Diagnosis not present

## 2019-11-23 DIAGNOSIS — K760 Fatty (change of) liver, not elsewhere classified: Secondary | ICD-10-CM | POA: Diagnosis not present

## 2019-11-23 DIAGNOSIS — Z79890 Hormone replacement therapy (postmenopausal): Secondary | ICD-10-CM | POA: Diagnosis not present

## 2019-11-23 DIAGNOSIS — R1011 Right upper quadrant pain: Secondary | ICD-10-CM | POA: Diagnosis not present

## 2019-11-23 DIAGNOSIS — Z8379 Family history of other diseases of the digestive system: Secondary | ICD-10-CM | POA: Diagnosis not present

## 2019-11-23 LAB — URINALYSIS, ROUTINE W REFLEX MICROSCOPIC
Bilirubin Urine: NEGATIVE
Glucose, UA: NEGATIVE mg/dL
Hgb urine dipstick: NEGATIVE
Ketones, ur: 80 mg/dL — AB
Nitrite: NEGATIVE
Protein, ur: 30 mg/dL — AB
Specific Gravity, Urine: 1.027 (ref 1.005–1.030)
pH: 5 (ref 5.0–8.0)

## 2019-11-23 LAB — SARS CORONAVIRUS 2 BY RT PCR (HOSPITAL ORDER, PERFORMED IN ~~LOC~~ HOSPITAL LAB): SARS Coronavirus 2: NEGATIVE

## 2019-11-23 LAB — HIV ANTIBODY (ROUTINE TESTING W REFLEX): HIV Screen 4th Generation wRfx: NONREACTIVE

## 2019-11-23 LAB — PREGNANCY, URINE: Preg Test, Ur: NEGATIVE

## 2019-11-23 MED ORDER — SODIUM CHLORIDE 0.9 % IV SOLN
2.0000 g | INTRAVENOUS | Status: DC
  Administered 2019-11-23: 2 g via INTRAVENOUS
  Filled 2019-11-23 (×2): qty 20

## 2019-11-23 MED ORDER — ROSUVASTATIN CALCIUM 5 MG PO TABS: 5.0000 mg | ORAL_TABLET | ORAL | Status: DC

## 2019-11-23 MED ORDER — ONDANSETRON HCL 4 MG PO TABS: 4.0000 mg | ORAL_TABLET | Freq: Four times a day (QID) | ORAL | Status: DC | PRN

## 2019-11-23 MED ORDER — ACETAMINOPHEN 650 MG RE SUPP: 650.0000 mg | Freq: Four times a day (QID) | RECTAL | Status: DC | PRN

## 2019-11-23 MED ORDER — GADOBUTROL 1 MMOL/ML IV SOLN
7.2000 mL | Freq: Once | INTRAVENOUS | Status: AC | PRN
  Administered 2019-11-23: 7.2 mL via INTRAVENOUS

## 2019-11-23 MED ORDER — MORPHINE SULFATE (PF) 2 MG/ML IV SOLN: 2.0000 mg | INTRAVENOUS | Status: DC | PRN

## 2019-11-23 MED ORDER — LEVOTHYROXINE SODIUM 100 MCG PO TABS
100.0000 ug | ORAL_TABLET | Freq: Every day | ORAL | Status: DC
  Filled 2019-11-23: qty 1

## 2019-11-23 MED ORDER — LORAZEPAM 2 MG/ML IJ SOLN
0.5000 mg | Freq: Once | INTRAMUSCULAR | Status: AC
  Administered 2019-11-23: 0.5 mg via INTRAVENOUS
  Filled 2019-11-23: qty 1

## 2019-11-23 MED ORDER — LORAZEPAM 2 MG/ML IJ SOLN
1.0000 mg | INTRAMUSCULAR | Status: AC | PRN
  Administered 2019-11-23: 1 mg via INTRAVENOUS
  Filled 2019-11-23: qty 1

## 2019-11-23 MED ORDER — ONDANSETRON HCL 4 MG/2ML IJ SOLN: 4.0000 mg | Freq: Four times a day (QID) | INTRAMUSCULAR | Status: DC | PRN

## 2019-11-23 MED ORDER — LACTATED RINGERS IV SOLN: INTRAVENOUS | Status: DC

## 2019-11-23 MED ORDER — SODIUM CHLORIDE 0.9 % IV BOLUS
1000.0000 mL | Freq: Once | INTRAVENOUS | Status: AC
  Administered 2019-11-23: 1000 mL via INTRAVENOUS

## 2019-11-23 MED ORDER — ACETAMINOPHEN 325 MG PO TABS: 650.0000 mg | ORAL_TABLET | Freq: Four times a day (QID) | ORAL | Status: DC | PRN

## 2019-11-23 NOTE — Consult Note (Addendum)
Referring Provider: Harvie Heck, PA-C (ED) Primary Care Physician:  Joycelyn Rua, MD Primary Gastroenterologist:  Dr. Bosie Clos [facility])  Reason for Consultation:  RUQ pain  HPI: Judith Long is a 49 y.o. female with history of cholelithiasis and endometriosis presenting with a chief complaint of right upper quadrant abdominal pain.  Patient states she had severe, acute onset right upper quadrant pain starting Friday 6/11.  The pain continued to worsen and reach its peak on Saturday 6/12 and was associated with subjective fever and chills.  She noted radiation of the pain to her epigastrium and under her right rib cage.  She also had severe nausea but no vomiting.  She was unable to eat any food because it caused even more abdominal discomfort.   She also noted some yellow stools, which she has never seen in the past.  She had some chest pressure and discomfort, as well as a sensation of acid reflux.  Chest pressure/pain has resolved.  Her symptoms started subsiding on Sunday, so she did not present to the ED at that time.  She has noted her urine is persistently dark and she has lingering RUQ discomfort, so she decided to present to the ED today.  Patient states she has had intermittent RUQ pain for several years, as well as a decreased appetite for several months.  Whenever she eats, she feels sick and unwell.  She denies any unintentional weight loss, but attributes this to hypothyroidism.  Patient also notes she feels itchy but states this may be related to stress.  Patient reports she has been on Crestor and has had monitoring of LFTs and has never had bilirubin elevations >1.5.  Started Crestor in 2019.  Patient denies any vomiting, hematemesis, dysphagia, diarrhea, constipation, melena, hematochezia.  Past medical history is pertinent for mother and brother with gallbladder problems requiring cholecystectomy.  Additionally, her father with colon cancer, aunt with colorectal  cancer, and another brother (deceased from [facility]) had Crohn's disease.  EGD 10/2016 was pertinent for acute gastritis (bx: negative for H. Pylori) and duodenal mucosal changes (bx: negative for ileitis, celiac, and Giardia) Colonoscopy 10/2016 for diarrhea showed small internal hemorrhoids but was otherwise unremarkable (bx: negative for microscopic colitis)  Past Medical History:  Diagnosis Date  . Antiphospholipid antibody syndrome (HCC) 2000  . Complication of anesthesia    slow to wake up. disoriented  . Gallstones 2016   noted on CT  . IBS (irritable bowel syndrome)    diagnosed in [place]  . Thyroid disease     Past Surgical History:  Procedure Laterality Date  . ANKLE SURGERY     ligaments repaired, done in [place]  . DILATATION & CURETTAGE/HYSTEROSCOPY WITH MYOSURE N/A 02/24/2017   Procedure: DILATATION & CURETTAGE/HYSTEROSCOPY;  Surgeon: Jerene Bears, MD;  Location: [facility];  Service: Gynecology;  Laterality: N/A;  . ECTOPIC PREGNANCY SURGERY  2000  . LAPAROSCOPY N/A 02/24/2017   Procedure: LAPAROSCOPY DIAGNOSTIC, LYSIS OF ADHESIONS, SALPINECTOMY;  Surgeon: Jerene Bears, MD;  Location: [facility];  Service: Gynecology;  Laterality: N/A;  . WISDOM TOOTH EXTRACTION      Prior to Admission medications   Medication Sig Start Date End Date Taking? Authorizing Provider  Cholecalciferol (VITAMIN D) 2000 UNITS tablet Take 2,000 Units by mouth 2 (two) times daily.    Yes [provider]  dicyclomine (BENTYL) 10 MG capsule Take 10 mg by mouth 3 (three) times daily as needed for spasms.    Yes [provider]  IBUPROFEN PO Take 200 mg by mouth every 6 (six) hours as needed (pain).    Yes [provider]  levothyroxine (SYNTHROID, LEVOTHROID) 100 MCG tablet Take 100 mcg by mouth daily before breakfast.   Yes [provider]  rosuvastatin (CRESTOR) 5 MG tablet Take 5 mg by mouth 3 (three) times a week. Paulo Fruit,  Friday 11/17/19  Yes [provider]    Scheduled Meds: . sodium chloride flush  3 mL Intravenous Once   Continuous Infusions: PRN Meds:.LORazepam  Allergies as of 11/22/2019 - Review Complete 11/22/2019  Allergen Reaction Noted  . Toradol [ketorolac tromethamine] Hives 02/17/2017  . Aspirin Rash 04/19/2015    Family History  Problem Relation Age of Onset  . Addison's disease Mother   . Colon cancer Father   . Diabetes Maternal Grandmother     Social History   Socioeconomic History  . Marital status: Married    Spouse name: Not on file  . Number of children: Not on file  . Years of education: Not on file  . Highest education level: Not on file  Occupational History  . Not on file  Tobacco Use  . Smoking status: Never Smoker  . Smokeless tobacco: Never Used  Vaping Use  . Vaping Use: Never used  Substance and Sexual Activity  . Alcohol use: Yes    Alcohol/week: 7.0 standard drinks    Types: 7 Glasses of wine per week    Comment: daily  . Drug use: No  . Sexual activity: Yes    Birth control/protection: Rhythm  Other Topics Concern  . Not on file  Social History Narrative  . Not on file   Social Determinants of Health   Financial Resource Strain:   . Difficulty of Paying Living Expenses:   Food Insecurity:   . Worried About Programme researcher, broadcasting/film/video in the Last Year:   . Barista in the Last Year:   Transportation Needs:   . Freight forwarder (Medical):   Marland Kitchen Lack of Transportation (Non-Medical):   Physical Activity:   . Days of Exercise per Week:   . Minutes of Exercise per Session:   Stress:   . Feeling of Stress :   Social Connections:   . Frequency of Communication with Friends and Family:   . Frequency of Social Gatherings with Friends and Family:   . Attends Religious Services:   . Active Member of Clubs or Organizations:   . Attends Banker Meetings:   Marland Kitchen Marital Status:   Intimate Partner Violence:   . Fear of  Current or Ex-Partner:   . Emotionally Abused:   Marland Kitchen Physically Abused:   . Sexually Abused:     Review of Systems: Review of Systems  Constitutional: Positive for chills and fever (subjective). Negative for weight loss.  HENT: Negative for hearing loss and tinnitus.   Eyes: Negative for pain and redness.  Respiratory: Negative for cough and shortness of breath.   Cardiovascular: Negative for chest pain and palpitations.  Gastrointestinal: Positive for abdominal pain, heartburn and nausea. Negative for blood in stool, constipation, diarrhea, melena and vomiting.  Genitourinary: Negative for flank pain and hematuria.       Dark yellow  Musculoskeletal: Negative for falls and joint pain.  Skin: Positive for itching. Negative for rash.  Neurological: Negative for seizures and loss of consciousness.  Endo/Heme/Allergies: Negative for polydipsia. Does not bruise/bleed easily.  Psychiatric/Behavioral: Negative for substance abuse. The patient is nervous/anxious.  Physical Exam: Vital signs: Vitals:   11/23/19 0841 11/23/19 0928  BP: (!) 146/91 (!) 147/83  Pulse: 79 86  Resp: 16 17  Temp:    SpO2: 98% 100%      Physical Exam Constitutional:      General: She is not in acute distress.    Appearance: She is well-developed.  HENT:     Head: Normocephalic and atraumatic.  Eyes:     General: Scleral icterus present.     Extraocular Movements: Extraocular movements intact.  Cardiovascular:     Rate and Rhythm: Regular rhythm. Tachycardia present.     Heart sounds: Normal heart sounds.  Pulmonary:     Effort: Pulmonary effort is normal. No respiratory distress.     Breath sounds: Normal breath sounds.  Abdominal:     General: Bowel sounds are normal. There is no distension.     Palpations: Abdomen is soft. There is no hepatomegaly or mass.     Tenderness: There is abdominal tenderness in the right upper quadrant and epigastric area. There is no guarding or rebound. Negative  signs include Murphy's sign.  Skin:    General: Skin is warm and dry.  Neurological:     General: No focal deficit present.     Mental Status: She is alert and oriented to person, place, and time.  Psychiatric:        Mood and Affect: Mood normal.        Behavior: Behavior normal.     GI:  Lab Results: Recent Labs    11/22/19 1907  WBC 9.1  HGB 16.1*  HCT 47.3*  PLT 174   BMET Recent Labs    11/22/19 1907  NA 139  K 4.3  CL 103  CO2 23  GLUCOSE 108*  BUN 6  CREATININE 0.76  CALCIUM 9.9   LFT Recent Labs    11/22/19 1907  PROT 7.4  ALBUMIN 4.2  AST 364*  ALT 478*  ALKPHOS 85  BILITOT 4.4*   PT/INR No results for input(s): LABPROT, INR in the last 72 hours.   Studies/Results: US Abdomen Limited RUQ  Result Date: 11/23/2019 CLINICAL DATA:  Acute right upper quadrant abdominal pain. EXAM: ULTRASOUND ABDOMEN LIMITED RIGHT UPPER QUADRANT COMPARISON:  April 19, 2015. FINDINGS: Gallbladder: Mild cholelithiasis is noted without gallbladder wall thickening or pericholecystic fluid. No sonographic Murphy's sign is noted. Common bile duct: Diameter: 5 mm which is within normal limits. Liver: No focal lesion identified. Increased echogenicity of hepatic parenchyma is noted suggesting hepatic steatosis. Portal vein is patent on color Doppler imaging with normal direction of blood flow towards the liver. Other: None. IMPRESSION: Mild cholelithiasis without evidence of cholecystitis. Probable hepatic steatosis. Electronically Signed   By: Lupita Raider M.D.   On: 11/23/2019 08:36    Impression: RUQ abdominal pain, cholelithiasis - intermittent RUQ pain, worse with meals, with acute exacerbation over this weekend.  Suspect symptomatic cholelithiasis vs. passed gallstone. -T bili 4.4/AST 364/ALT 478/ALP 85 -Ultrasound today showed mild cholelithiasis without evidence of cholecystitis.  Probable hepatic steatosis -WBCs 9.1 -Lipase normal (47)  Plan: We will proceed  with MRCP to rule out CBD stones due to elevated T bili and AST/ALT.  If MRCP is positive, we will proceed with ERCP. I thoroughly discussed the procedure, nature, benefits, and risks (including but not limited to pancreatitis, infection, perforation, bleeding, anesthesia).  Patient is in agreement to proceed with ERCP if positive MRCP.  If negative MRCP, recommend laparoscopic cholecystectomy with IOC.  Recommend surgical consult for laparoscopic cholecystectomy.  Continue to trend LFTs.  [facility] will follow.   LOS: 0 days   Salley Slaughter  PA-C 11/23/2019, 10:14 AM  Contact #  [phone]

## 2019-11-23 NOTE — ED Notes (Signed)
Pt ambulatory to restroom with steady gait.

## 2019-11-23 NOTE — H&P (Signed)
History and Physical    Margret ChanceKathryn Parkhill ZOX:[id number] DOB: 01/06/1971 DOA: 11/22/2019  PCP: Joycelyn RuaMeyers, Stephen, MD Consultants:  Hyacinth MeekerMiller - GYN Patient coming from:  Home - lives with husband; NOK: Husband, [phone]  Chief Complaint: RUQ pain  HPI: Margret ChanceKathryn Justiss is a 49 y.o. female with medical history significant of IBS; gallstones; antiphospholipid syndrome; and hypothyroidism presenting with RUQ pain and nausea. She reports that she is having a "gallbladder rebellion."  Last weekend, she began having hot flashes, cold sweats, fever, and RUQ pain.  She has had periodic bloating for years but this was worse.  The pain was particularly bad Saturday and radiated into her back, making her feel like she was having a heart attack.  She felt like she passed the stone and the pain eased and she felt better on Sunday.  On Monday, she ate just a bite of something and the symptoms recurred.  +Nausea with eating.  Her urine turned dark and dense and "sticky."  Her stools became clay colored.   ED Course:   Concern for choledocholithiasis - US negative but stones in GB, elevated LFTs.  MRCP is ordered, surgery and GI to see.  Review of Systems: As per HPI; otherwise review of systems reviewed and negative.   Ambulatory Status:  Ambulates without assistance    Past Medical History:  Diagnosis Date  . Antiphospholipid antibody syndrome (HCC) 2000  . Complication of anesthesia    slow to wake up. disoriented  . Endometriosis   . Gallstones 2016   noted on CT  . Hypothyroidism (acquired)   . IBS (irritable bowel syndrome)    [place]  . RSD (reflex sympathetic dystrophy)     Past Surgical History:  Procedure Laterality Date  . ANKLE SURGERY     ligaments repaired, done in [place]  . DILATATION & CURETTAGE/HYSTEROSCOPY WITH MYOSURE N/A 02/24/2017   Procedure: DILATATION & CURETTAGE/HYSTEROSCOPY;  Surgeon: Jerene BearsMiller, Mary S, MD;  [facility];   Service: Gynecology;  Laterality: N/A;  . ECTOPIC PREGNANCY SURGERY  2000  . LAPAROSCOPY N/A 02/24/2017   Procedure: LAPAROSCOPY DIAGNOSTIC, LYSIS OF ADHESIONS, SALPINECTOMY;  Surgeon: Jerene BearsMiller, Mary S, MD;  [facility] [place];  Service: Gynecology;  Laterality: N/A;  . WISDOM TOOTH EXTRACTION      Social History   Socioeconomic History  . Marital status: Married    Spouse name: Not on file  . Number of children: Not on file  . Years of education: Not on file  . Highest education level: Not on file  Occupational History  . Occupation: unemployed  Tobacco Use  . Smoking status: Never Smoker  . Smokeless tobacco: Never Used  Vaping Use  . Vaping Use: Never used  Substance and Sexual Activity  . Alcohol use: Yes    Alcohol/week: 14.0 - 21.0 standard drinks    Types: 14 - 21 Glasses of wine per week    Comment: drinks daily; she does not think she has a problem with alcohol but "my doctor does"  . Drug use: No  . Sexual activity: Yes    Birth control/protection: Rhythm  Other Topics Concern  . Not on file  Social History Narrative  . Not on file   Social Determinants of Health   Financial Resource Strain:   . Difficulty of Paying Living Expenses:   Food Insecurity:   . Worried About Programme researcher, broadcasting/film/videounning Out of Food in the Last Year:   . [employer] Out of Food in the Last  Year:   Transportation Needs:   . Film/video editor (Medical):   Marland Kitchen Lack of Transportation (Non-Medical):   Physical Activity:   . Days of Exercise per Week:   . Minutes of Exercise per Session:   Stress:   . Feeling of Stress :   Social Connections:   . Frequency of Communication with Friends and Family:   . Frequency of Social Gatherings with Friends and Family:   . Attends Religious Services:   . Active Member of Clubs or Organizations:   . Attends Archivist Meetings:   Marland Kitchen Marital Status:   Intimate Partner Violence:   . Fear of Current or Ex-Partner:   . Emotionally Abused:   Marland Kitchen  Physically Abused:   . Sexually Abused:     Allergies  Allergen Reactions  . Toradol [Ketorolac Tromethamine] Hives  . Aspirin Rash    fever    Family History  Problem Relation Age of Onset  . Addison's disease Mother   . Colon cancer Father   . Diabetes Maternal Grandmother     Prior to Admission medications   Medication Sig Start Date End Date Taking? Authorizing Provider  Cholecalciferol (VITAMIN D) 2000 UNITS tablet Take 2,000 Units by mouth 2 (two) times daily.    Yes [provider]  dicyclomine (BENTYL) 10 MG capsule Take 10 mg by mouth 3 (three) times daily as needed for spasms.    Yes [provider]  IBUPROFEN PO Take 200 mg by mouth every 6 (six) hours as needed (pain).    Yes [provider]  levothyroxine (SYNTHROID, LEVOTHROID) 100 MCG tablet Take 100 mcg by mouth daily before breakfast.   Yes [provider]  rosuvastatin (CRESTOR) 5 MG tablet Take 5 mg by mouth 3 (three) times a week. Jory Sims, Friday 11/17/19  Yes [provider]    Physical Exam: Vitals:   11/23/19 1400 11/23/19 1431 11/23/19 1508 11/23/19 1544  BP: (!) 133/91 132/84 125/84 135/89  Pulse: 85 66 75   Resp: 17 14 17 18   Temp:   98.9 F (37.2 C) 97.9 F (36.6 C)  TempSrc:   Oral Oral  SpO2: 97% 97% 98% 99%  Weight:      Height:         . General:  Appears calm and comfortable and is NAD . Eyes:  EOMI, normal lids, iris . ENT:  grossly normal hearing, lips & tongue, mmm; appropriate dentition . Neck:  no LAD, masses or thyromegaly . Cardiovascular:  RRR, no m/r/g. No LE edema.  Marland Kitchen Respiratory:   CTA bilaterally with no wheezes/rales/rhonchi.  Normal respiratory effort. . Abdomen:  soft, very TTP in midepigastric and RUQ, ND, hypoactive BS . Skin:  no rash or induration seen on limited exam . Musculoskeletal:  grossly normal tone BUE/BLE, good ROM, no bony abnormality . Psychiatric:  grossly normal mood and affect, speech fluent and  appropriate, AOx3 . Neurologic:  CN 2-12 grossly intact, moves all extremities in coordinated fashion    Radiological Exams on Admission: MR 3D Recon At Scanner  Result Date: 11/23/2019 CLINICAL DATA:  Cholelithiasis EXAM: MRI ABDOMEN WITHOUT AND WITH CONTRAST (INCLUDING MRCP) TECHNIQUE: Multiplanar multisequence MR imaging of the abdomen was performed both before and after the administration of intravenous contrast. Heavily T2-weighted images of the biliary and pancreatic ducts were obtained, and three-dimensional MRCP images were rendered by post processing. CONTRAST:  7.6mL GADAVIST GADOBUTROL 1 MMOL/ML IV SOLN COMPARISON:  Ultrasound evaluation of the same date  and CT study from 04/19/2015 FINDINGS: Lower chest: Incidental imaging of the lung bases is unremarkable. Hepatobiliary: Cholelithiasis and suspected adenomyomatosis of the gallbladder fundus. Area not well evaluated on today's study. Very mild thickening focally in the fundus of the gallbladder. Subtle cystic change suggested on T2 images. No pericholecystic stranding. No biliary ductal dilation or filling defect in the common bile duct. Marked hepatic steatosis as exhibited by profound signal loss on at of phase as compared to in phase T1 weighted gradient echo imaging. No focal, suspicious hepatic lesion with patent portal vein. Pancreas: Normal without focal lesion, ductal dilation or signs of inflammation. Spleen:  Spleen normal size without focal lesion. Adrenals/Urinary Tract:  Normal adrenal glands. Smooth renal contours. No hydronephrosis or perinephric edema. Yes or Stomach/Bowel: No acute gastrointestinal process to the extent evaluated. Study not protocol for bowel evaluation. Pelvic bowel loops are not imaged. Vascular/Lymphatic: Vascular structures in the abdomen are patent. No sign of adenopathy. Other:  No ascites. Musculoskeletal: No acute or suspicious bone finding to the extent evaluated. IMPRESSION: 1. Cholelithiasis without  choledocholithiasis, signs of cholecystitis or biliary obstruction. Profound hepatic steatosis may explain hepatic enzyme abnormalities. Potentially due to steatohepatitis. Hepatology consultation/follow-up may be helpful for clarification and to guide future follow-up. 2. Suspected adenomyomatosis of the gallbladder fundus. Focused evaluation on follow-up of this area with ultrasound may be helpful. Electronically Signed   By: Donzetta Kohut M.D.   On: 11/23/2019 12:18   MR ABDOMEN MRCP W WO CONTAST  Result Date: 11/23/2019 CLINICAL DATA:  Cholelithiasis EXAM: MRI ABDOMEN WITHOUT AND WITH CONTRAST (INCLUDING MRCP) TECHNIQUE: Multiplanar multisequence MR imaging of the abdomen was performed both before and after the administration of intravenous contrast. Heavily T2-weighted images of the biliary and pancreatic ducts were obtained, and three-dimensional MRCP images were rendered by post processing. CONTRAST:  7.48mL GADAVIST GADOBUTROL 1 MMOL/ML IV SOLN COMPARISON:  Ultrasound evaluation of the same date and CT study from 04/19/2015 FINDINGS: Lower chest: Incidental imaging of the lung bases is unremarkable. Hepatobiliary: Cholelithiasis and suspected adenomyomatosis of the gallbladder fundus. Area not well evaluated on today's study. Very mild thickening focally in the fundus of the gallbladder. Subtle cystic change suggested on T2 images. No pericholecystic stranding. No biliary ductal dilation or filling defect in the common bile duct. Marked hepatic steatosis as exhibited by profound signal loss on at of phase as compared to in phase T1 weighted gradient echo imaging. No focal, suspicious hepatic lesion with patent portal vein. Pancreas: Normal without focal lesion, ductal dilation or signs of inflammation. Spleen:  Spleen normal size without focal lesion. Adrenals/Urinary Tract:  Normal adrenal glands. Smooth renal contours. No hydronephrosis or perinephric edema. Yes or Stomach/Bowel: No acute  gastrointestinal process to the extent evaluated. Study not protocol for bowel evaluation. Pelvic bowel loops are not imaged. Vascular/Lymphatic: Vascular structures in the abdomen are patent. No sign of adenopathy. Other:  No ascites. Musculoskeletal: No acute or suspicious bone finding to the extent evaluated. IMPRESSION: 1. Cholelithiasis without choledocholithiasis, signs of cholecystitis or biliary obstruction. Profound hepatic steatosis may explain hepatic enzyme abnormalities. Potentially due to steatohepatitis. Hepatology consultation/follow-up may be helpful for clarification and to guide future follow-up. 2. Suspected adenomyomatosis of the gallbladder fundus. Focused evaluation on follow-up of this area with ultrasound may be helpful. Electronically Signed   By: Donzetta Kohut M.D.   On: 11/23/2019 12:18   US Abdomen Limited RUQ  Result Date: 11/23/2019 CLINICAL DATA:  Acute right upper quadrant abdominal pain. EXAM: ULTRASOUND ABDOMEN LIMITED  RIGHT UPPER QUADRANT COMPARISON:  April 19, 2015. FINDINGS: Gallbladder: Mild cholelithiasis is noted without gallbladder wall thickening or pericholecystic fluid. No sonographic Murphy's sign is noted. Common bile duct: Diameter: 5 mm which is within normal limits. Liver: No focal lesion identified. Increased echogenicity of hepatic parenchyma is noted suggesting hepatic steatosis. Portal vein is patent on color Doppler imaging with normal direction of blood flow towards the liver. Other: None. IMPRESSION: Mild cholelithiasis without evidence of cholecystitis. Probable hepatic steatosis. Electronically Signed   By: Lupita Raider M.D.   On: 11/23/2019 08:36    EKG: Independently reviewed.  NSR with rate 80; no evidence of acute ischemia   Labs on Admission: I have personally reviewed the available labs and imaging studies at the time of the admission.  Pertinent labs:   Glucose 108 AST 364/ALT 478/Bili 4.4 WBC 9.1 Hgb  16.1   Assessment/Plan Principal Problem:   Choledocholithiasis Active Problems:   Irritable bowel syndrome with both constipation and diarrhea   Antiphospholipid antibody syndrome (HCC)   RSD (reflex sympathetic dystrophy)   Hypothyroidism (acquired)   Obesity (BMI 30.0-34.9)   Choledocholithiasis -Patient with known cholelithiasis presenting with worsening RUQ/midepigastric pain -Elevated LFTs, concerning for ductal stone/obstruction -MRCP done and appears to show NO choledocholithiasis but profound hepatic steatosis and also adenomyomatosis of the gallbladder fundus -GI has signed off for now -Surgery has seen and suspects that she passed a stone -Plan for lap chole with IOC in AM -Full liquids, NPO after midnight -Morphine for pain, Zofran for nausea -No antibiotic coverage for now since there does not appear to be an infectious component  IBS -Hold Bentyl for now -Bowel habits are currently likely influenced by gallbladder disease  Antiphospholipid syndrome -She likely needs to start 81 mg ASA daily -Otherwise, no specific management is needed at this time  RSD -She does not appear to be taking medications for this issue at this time  Hypothyroidism -Continue Synthroid at current dose for now  HLD -Continue Crestor  Obesity Body mass index is 31.25 kg/m.  -Weight loss should be encouraged -Outpatient PCP/bariatric medicine f/u encouraged    Note: This patient has been tested and is negative for the novel coronavirus COVID-19.  DVT prophylaxis: SCDs Code Status:  Full - confirmed with patient/family Family Communication: Husband was present throughout evaluation Disposition Plan:  The patient is from: home  Anticipated d/c is to: home without [facility] services   Anticipated d/c date will depend on clinical response to treatment, likely 1-2 days following surgery  Patient is currently: acutely ill Consults called: GI; surgery Admission status:  Admit - It is my  clinical opinion that admission to INPATIENT is reasonable and necessary because of the expectation that this patient will require hospital care that crosses at least 2 midnights to treat this condition based on the medical complexity of the problems presented.  Given the aforementioned information, the predictability of an adverse outcome is felt to be significant.    Jonah Blue MD [facility]   How to contact the [facility] Attending or Consulting provider 7A - 7P or covering provider during after hours 7P -7A, for this patient?  1. Check the care team in [facility] and look for a) attending/consulting [facility] provider listed and b) the [facility] team listed 2. Log into [email or web address] and use [facility]'s universal password to access. If you do not have the password, please contact the hospital operator. 3. Locate the [facility] provider you are looking for under [facility] and page  to a number that you can be directly reached. 4. If you still have difficulty reaching the provider, please page the [facility] (Director on Call) for the Hospitalists listed on amion for assistance.   11/23/2019, 4:25 PM

## 2019-11-23 NOTE — Progress Notes (Signed)
Patient arrived to unit in NAD,VS stable, patient free from pain. Patient oriented to room and husband aware of admission.

## 2019-11-23 NOTE — ED Provider Notes (Signed)
[facility] EMERGENCY DEPARTMENT Provider Note   CSN: [id number] Arrival date & time: 11/22/19  1843     History Chief Complaint  Patient presents with   Abdominal Pain    Judith Long is a 49 y.o. female with a history of cholelithiasis & IBS who presents to the ED with complaints of abdominal pain x 4 days. Patient states that she had severe RUQ pain radiating through to the back 06/12 w/ N/V that lasted for the majority of the day and seemed to ease off some the following day, but remained present. She decided to only drink chicken broth throughout the day 06/13, and then yesterday tried to eat a piece of chicken and immediately had return of more significant discomfort with N/V which prompted ED visit last night. She states currently she is not nauseated and her pain is mild. Other than eating no alleviating/aggravating factors. She has noted dark urine & pale stool and well as subjective fevers. She has a history of gallstones. Denies hematemesis, dyspnea, diarrhea, melena, dysuria, or syncope.   HPI     Past Medical History:  Diagnosis Date   Antiphospholipid antibody syndrome (HCC) 2000   Complication of anesthesia    slow to wake up. disoriented   Gallstones 2016   noted on CT   IBS (irritable bowel syndrome)    diagnosed in [place]   Thyroid disease     Patient Active Problem List   Diagnosis Date Noted   Chronic pelvic pain in female 02/21/2017   Endometrial mass 02/21/2017   Generalized abdominal pain 02/21/2017   Antiphospholipid antibody syndrome (HCC) 02/21/2017   History of recurrent miscarriages 01/10/2017   DUB (dysfunctional uterine bleeding) 01/10/2017   Irritable bowel syndrome with both constipation and diarrhea 01/10/2017    Past Surgical History:  Procedure Laterality Date   ANKLE SURGERY     ligaments repaired, done in [place]   DILATATION & CURETTAGE/HYSTEROSCOPY WITH MYOSURE N/A 02/24/2017    Procedure: DILATATION & CURETTAGE/HYSTEROSCOPY;  Surgeon: Jerene Bears, MD;  Location: [facility];  Service: Gynecology;  Laterality: N/A;   ECTOPIC PREGNANCY SURGERY  2000   LAPAROSCOPY N/A 02/24/2017   Procedure: LAPAROSCOPY DIAGNOSTIC, LYSIS OF ADHESIONS, SALPINECTOMY;  Surgeon: Jerene Bears, MD;  Location: [facility];  Service: Gynecology;  Laterality: N/A;   WISDOM TOOTH EXTRACTION       OB History    Gravida  11   Para      Term      Preterm      AB  11   Living        SAB  9   TAB      Ectopic  2   Multiple      Live Births              Family History  Problem Relation Age of Onset   Addison's disease Mother    Colon cancer Father    Diabetes Maternal Grandmother     Social History   Tobacco Use   Smoking status: Never Smoker   Smokeless tobacco: Never Used  Vaping Use   Vaping Use: Never used  Substance Use Topics   Alcohol use: Yes    Alcohol/week: 7.0 standard drinks    Types: 7 Glasses of wine per week    Comment: daily   Drug use: No    Home Medications Prior to Admission medications   Medication Sig Start Date End Date Taking? Authorizing  Provider  cetirizine (ZYRTEC) 10 MG tablet Take 5 mg by mouth as needed.     [provider]  Cholecalciferol (VITAMIN D) 2000 UNITS tablet Take 2,000 Units by mouth daily.    [provider]  dicyclomine (BENTYL) 10 MG capsule Take 10 mg by mouth 3 (three) times daily as needed for spasms.     [provider]  IBUPROFEN PO Take 200 mg by mouth.    [provider]  levothyroxine (SYNTHROID, LEVOTHROID) 100 MCG tablet Take 100 mcg by mouth daily before breakfast.    [provider]  norethindrone (MICRONOR,CAMILA,ERRIN) 0.35 MG tablet Take 1 tablet (0.35 mg total) by mouth daily. 04/07/17   Jerene Bears, MD  Probiotic Product (ALIGN PO) Take by mouth.    [provider]  psyllium (HYDROCIL/METAMUCIL) 95  % PACK Take 1 packet by mouth daily as needed for mild constipation.     [provider]    Allergies    Toradol [ketorolac tromethamine] and Aspirin  Review of Systems   Review of Systems  Constitutional: Positive for fever (subjective).  Respiratory: Negative for shortness of breath.   Gastrointestinal: Positive for abdominal pain, nausea and vomiting. Negative for blood in stool, constipation and diarrhea.       Positive for pale colored stools.   Genitourinary: Negative for dysuria and vaginal discharge.       Positive for dark colored urine.   Neurological: Negative for syncope.  All other systems reviewed and are negative.  Physical Exam Updated Vital Signs BP (!) 149/91 (BP Location: Right Arm)    Pulse 69    Temp 97.8 F (36.6 C) (Oral)    Resp 16    SpO2 98%   Physical Exam Vitals and nursing note reviewed.  Constitutional:      General: She is not in acute distress.    Appearance: She is well-developed. She is not toxic-appearing.  HENT:     Head: Normocephalic and atraumatic.  Eyes:     General:        Right eye: No discharge.        Left eye: No discharge.     Conjunctiva/sclera: Conjunctivae normal.  Cardiovascular:     Rate and Rhythm: Normal rate and regular rhythm.  Pulmonary:     Effort: Pulmonary effort is normal. No respiratory distress.     Breath sounds: Normal breath sounds. No wheezing, rhonchi or rales.  Abdominal:     General: There is no distension.     Palpations: Abdomen is soft.     Tenderness: There is abdominal tenderness in the right upper quadrant. There is no guarding or rebound. Negative signs include Murphy's sign.  Musculoskeletal:     Cervical back: Neck supple.  Skin:    General: Skin is warm and dry.     Findings: No rash.  Neurological:     Mental Status: She is alert.     Comments: Clear speech.   Psychiatric:        Behavior: Behavior normal.     ED Results / Procedures / Treatments   Labs (all labs ordered  are listed, but only abnormal results are displayed) Labs Reviewed  COMPREHENSIVE METABOLIC PANEL - Abnormal; Notable for the following components:      Result Value   Glucose, Bld 108 (*)    AST 364 (*)    ALT 478 (*)    Total Bilirubin 4.4 (*)    All other components within normal limits  CBC - Abnormal; Notable for the following components:   Hemoglobin 16.1 (*)    HCT 47.3 (*)    All other components within normal limits  LIPASE, BLOOD  URINALYSIS, ROUTINE W REFLEX MICROSCOPIC  I-STAT BETA HCG BLOOD, ED ([facility], [facility], [facility] ONLY)    EKG None  Radiology US Abdomen Limited RUQ  Result Date: 11/23/2019 CLINICAL DATA:  Acute right upper quadrant abdominal pain. EXAM: ULTRASOUND ABDOMEN LIMITED RIGHT UPPER QUADRANT COMPARISON:  April 19, 2015. FINDINGS: Gallbladder: Mild cholelithiasis is noted without gallbladder wall thickening or pericholecystic fluid. No sonographic Murphy's sign is noted. Common bile duct: Diameter: 5 mm which is within normal limits. Liver: No focal lesion identified. Increased echogenicity of hepatic parenchyma is noted suggesting hepatic steatosis. Portal vein is patent on color Doppler imaging with normal direction of blood flow towards the liver. Other: None. IMPRESSION: Mild cholelithiasis without evidence of cholecystitis. Probable hepatic steatosis. Electronically Signed   By: Lupita Raider M.D.   On: 11/23/2019 08:36    Procedures Procedures (including critical care time)  Medications Ordered in ED Medications  sodium chloride flush (NS) 0.9 % injection 3 mL (has no administration in time range)    ED Course  I have reviewed the triage vital signs and the nursing notes.  Pertinent labs & imaging results that were available during my care of the patient were reviewed by me and considered in my medical decision making (see chart for details).  Judith Long was evaluated in Emergency Department on 11/23/2019 for the symptoms described in the history  of present illness. He/she was evaluated in the context of the global COVID-19 pandemic, which necessitated consideration that the patient might be at risk for infection with the SARS-CoV-2 virus that causes COVID-19. Institutional protocols and algorithms that pertain to the evaluation of patients at risk for COVID-19 are in a state of rapid change based on information released by regulatory bodies including the CDC and federal and state organizations. These policies and algorithms were followed during the patient's care in the ED.    MDM Rules/Calculators/A&P                         Patient presents to the ED with complaints of RUQ abdominal pain w/ N/V. She is nontoxic, resting comfortably, mildly elevated BP low suspicion for HTN emergency. On exam RUQ tenderness.   Ddx: Cholelithiasis, choledocholithiasis, cholecystitis, cholangitis, GERD, PUD, hepatitis.  Additional history obtained:  Additional history obtained from nursing note & chart review.  Lab Tests:  I reviewed and interpreted labs, which included:  CBC: No anemia or leukocytosis, hgb/hct somewhat elevated similarly compared to prior.  CMP: LFTs & T bili elevation. Renal function & electrolytes WNL.  Lipase: WNL UA: pending Pregnancy test: pending.   Imaging Studies ordered:  I ordered imaging studies which included RUQ [place], I independently visualized and interpreted imaging which showed  Mild cholelithiasis without evidence of cholecystitis. Probable hepatic steatosis  ED Course:  Following initial assessment patient declining analgesics or anti-emetics, fluids ordered, she is aware if she changes her mind regarding pain/nausea medicines to alert staff.   07:51: Patient feeling very anxious, requesting something for anxiety, 0.5 mg of ativan IV ordered.   Right upper quadrant ultrasound without findings of cholecystitis.  Given her transaminitis and elevated total bilirubin will discuss with gastroenterology.  09:15:  CONSULT: Discussed with gastroenterologist Dr. Selinda Flavin MRCP, general surgery consultation, and admission to hospitalist service. 09:37: CONSULT: Discussed with hospitalist  Dr. Lorin Mercy- accepts admission.  10:39: CONSULT: Discussed with general surgeon Dr. Donne Hazel- surgery team will see patient in consultation.   Findings & plan of care discussed with patient- she is in agreement.  Findings and plan of care discussed with supervising physician Dr. Roslynn Amble who has evaluated the patient & is in agreement with plan of care.   Portions of this note were generated with [employer]. Dictation errors may occur despite best attempts at proofreading.  Final Clinical Impression(s) / ED Diagnoses Final diagnoses:  Abdominal pain  Calculus of gallbladder with biliary obstruction but without cholecystitis  Elevated LFTs    Rx / DC Orders ED Discharge Orders    None       Amaryllis Dyke, PA-C 11/23/19 1048    Lucrezia Starch, MD 11/23/19 1440

## 2019-11-23 NOTE — ED Notes (Signed)
Patient returned from MRI.

## 2019-11-23 NOTE — Consult Note (Signed)
[facility] Consult Note  Daven Pinckney 1971/03/26  [id number].    Requesting MD: Dr. Stevie Kern  Chief Complaint/Reason for Consult: cholelithiasis, elevated LFTs, RUQ pain  HPI:  Ms, Judith Long is a 49 y/o F with a PMH hypothyroidism, antiphospholipid antibody syndrome, and cholelithiasis who presented to the ED with a cc RUQ pain. Patient reports the pain started about 4 days ago - acute onset, non-radiating RUQ pain. Pain has gradually worsened. Associated sxs include fever, chills, nausea, and heartburn. Denies emesis. Reports clay colored stools and dark urine recently. Reports similar pain in the past, off and on for about 5 years, worse after eating.   Family hx: mom/brother with GB disease, father colon cancer, brother with inflammatory bowel disease PSH: Dx laparoscopy and bilateral salpingectomy Social history: occasional alcohol use; denies tobacco or drug use  ROS: Review of Systems  Constitutional: Positive for chills and fever.  Respiratory: Negative for shortness of breath and wheezing.   Cardiovascular: Negative for chest pain and palpitations.  Gastrointestinal: Positive for abdominal pain, heartburn and nausea. Negative for blood in stool, constipation, diarrhea, melena and vomiting.  Genitourinary: Negative for dysuria, frequency and urgency.       Dark urine  Skin: Positive for itching.  All other systems reviewed and are negative.   Family History  Problem Relation Age of Onset  . Addison's disease Mother   . Colon cancer Father   . Diabetes Maternal Grandmother     Past Medical History:  Diagnosis Date  . Antiphospholipid antibody syndrome (HCC) 2000  . Complication of anesthesia    slow to wake up. disoriented  . Gallstones 2016   noted on CT  . IBS (irritable bowel syndrome)    diagnosed in [place]  . Thyroid disease     Past Surgical History:  Procedure Laterality Date  . ANKLE SURGERY     ligaments repaired, done in  [place]  . DILATATION & CURETTAGE/HYSTEROSCOPY WITH MYOSURE N/A 02/24/2017   Procedure: DILATATION & CURETTAGE/HYSTEROSCOPY;  Surgeon: Jerene Bears, MD;  Location: [facility];  Service: Gynecology;  Laterality: N/A;  . ECTOPIC PREGNANCY SURGERY  2000  . LAPAROSCOPY N/A 02/24/2017   Procedure: LAPAROSCOPY DIAGNOSTIC, LYSIS OF ADHESIONS, SALPINECTOMY;  Surgeon: Jerene Bears, MD;  Location: [facility];  Service: Gynecology;  Laterality: N/A;  . WISDOM TOOTH EXTRACTION      Social History:  reports that she has never smoked. She has never used smokeless tobacco. She reports current alcohol use of about 7.0 standard drinks of alcohol per week. She reports that she does not use drugs.  Allergies:  Allergies  Allergen Reactions  . Toradol [Ketorolac Tromethamine] Hives  . Aspirin Rash    fever    (Not in a hospital admission)   Blood pressure 124/81, pulse 94, temperature 97.8 F (36.6 C), temperature source Oral, resp. rate 16, height 5' (1.524 m), weight 72.6 kg, SpO2 98 %. Physical Exam: General: pleasant, WD, overweight white female who is laying in bed in NAD HEENT: Sclera are anicteric.  PERRL.  Ears and nose without any masses or lesions.  Mouth is pink and moist Heart: regular, rate, and rhythm.  Normal s1,s2. No obvious murmurs, gallops, or rubs noted.  Palpable radial and pedal pulses bilaterally Lungs: CTAB, no wheezes, rhonchi, or rales noted.  Respiratory effort nonlabored Abd: soft, mildly ttp in RUQ, ND, +BS, no masses, hernias, or organomegaly MS: all 4 extremities are symmetrical with no cyanosis, clubbing, or edema. Skin:  warm and dry with no masses, lesions, or rashes; not jaundiced Neuro: Cranial nerves 2-12 grossly intact, sensation is normal throughout Psych: A&Ox3 with an appropriate affect.   Results for orders placed or performed during the hospital encounter of 11/22/19 (from the past 48 hour(s))  Lipase, blood     Status:  None   Collection Time: 11/22/19  7:07 PM  Result Value Ref Range   Lipase 47 11 - 51 U/L    Comment: Performed at [facility], [address] [place], [place] [address]  Comprehensive metabolic panel     Status: Abnormal   Collection Time: 11/22/19  7:07 PM  Result Value Ref Range   Sodium 139 135 - 145 mmol/L   Potassium 4.3 3.5 - 5.1 mmol/L   Chloride 103 98 - 111 mmol/L   CO2 23 22 - 32 mmol/L   Glucose, Bld 108 (H) 70 - 99 mg/dL    Comment: Glucose reference range applies only to samples taken after fasting for at least 8 hours.   BUN 6 6 - 20 mg/dL   Creatinine, Ser 0.76 0.44 - 1.00 mg/dL   Calcium 9.9 8.9 - 10.3 mg/dL   Total Protein 7.4 6.5 - 8.1 g/dL   Albumin 4.2 3.5 - 5.0 g/dL   AST 364 (H) 15 - 41 U/L   ALT 478 (H) 0 - 44 U/L   Alkaline Phosphatase 85 38 - 126 U/L   Total Bilirubin 4.4 (H) 0.3 - 1.2 mg/dL   GFR calc non Af Amer >60 >60 mL/min   GFR calc Af Amer >60 >60 mL/min   Anion gap 13 5 - 15    Comment: Performed at [facility] [address], [place], [place] [address]  CBC     Status: Abnormal   Collection Time: 11/22/19  7:07 PM  Result Value Ref Range   WBC 9.1 4.0 - 10.5 K/uL   RBC 4.98 3.87 - 5.11 MIL/uL   Hemoglobin 16.1 (H) 12.0 - 15.0 g/dL   HCT 47.3 (H) 36 - 46 %   MCV 95.0 80.0 - 100.0 fL   MCH 32.3 26.0 - 34.0 pg   MCHC 34.0 30.0 - 36.0 g/dL   RDW 12.1 11.5 - 15.5 %   Platelets 174 150 - 400 K/uL   nRBC 0.0 0.0 - 0.2 %    Comment: Performed at [facility], [place] [address], [place], [place] [address]  Pregnancy, urine     Status: None   Collection Time: 11/23/19  8:08 AM  Result Value Ref Range   Preg Test, Ur NEGATIVE NEGATIVE    Comment:        THE SENSITIVITY OF THIS METHODOLOGY IS >20 mIU/mL. Performed at [facility], [place] [address], [place],  [address]    US Abdomen Limited RUQ  Result Date: 11/23/2019 CLINICAL DATA:  Acute right upper quadrant abdominal pain. EXAM: ULTRASOUND ABDOMEN LIMITED  RIGHT UPPER QUADRANT COMPARISON:  April 19, 2015. FINDINGS: Gallbladder: Mild cholelithiasis is noted without gallbladder wall thickening or pericholecystic fluid. No sonographic Murphy's sign is noted. Common bile duct: Diameter: 5 mm which is within normal limits. Liver: No focal lesion identified. Increased echogenicity of hepatic parenchyma is noted suggesting hepatic steatosis. Portal vein is patent on color Doppler imaging with normal direction of blood flow towards the liver. Other: None. IMPRESSION: Mild cholelithiasis without evidence of cholecystitis. Probable hepatic steatosis. Electronically Signed   By: Bobbe Medico.D.  On: 11/23/2019 08:36    Assessment/Plan Symptomatic cholelithiasis Elevated LFTs hyperbilirubinemia  - MRCP was negative for choledocholithiasis - suspect that patient may have passed a stone via CBD and may have actually had some degree of cholangitis - repeat LFTs in AM - if LFTs and Tbili trending down, will plan lap chole tomorrow, if trending up may need ERCP  FEN: ok to have FLD, IVF VTE: SCDs ID: [id number] 6/15>>  Adam Phenix, [facility] 11/23/2019, 11:03 AM

## 2019-11-24 ENCOUNTER — Encounter (HOSPITAL_COMMUNITY): Payer: Self-pay | Admitting: General Surgery

## 2019-11-24 ENCOUNTER — Other Ambulatory Visit: Payer: Self-pay

## 2019-11-24 ENCOUNTER — Encounter (HOSPITAL_COMMUNITY)
Admission: EM | Disposition: A | Discharge status: Home / Self Care | Payer: Self-pay | Source: Home / Self Care | Attending: Emergency Medicine

## 2019-11-24 ENCOUNTER — Other Ambulatory Visit: Payer: Self-pay | Admitting: General Surgery

## 2019-11-24 DIAGNOSIS — Z20822 Contact with and (suspected) exposure to covid-19: Secondary | ICD-10-CM | POA: Diagnosis not present

## 2019-11-24 DIAGNOSIS — K8010 Calculus of gallbladder with chronic cholecystitis without obstruction: Secondary | ICD-10-CM | POA: Diagnosis not present

## 2019-11-24 DIAGNOSIS — K8000 Calculus of gallbladder with acute cholecystitis without obstruction: Secondary | ICD-10-CM | POA: Diagnosis not present

## 2019-11-24 DIAGNOSIS — D6861 Antiphospholipid syndrome: Secondary | ICD-10-CM | POA: Diagnosis not present

## 2019-11-24 DIAGNOSIS — K8050 Calculus of bile duct without cholangitis or cholecystitis without obstruction: Secondary | ICD-10-CM | POA: Diagnosis not present

## 2019-11-24 DIAGNOSIS — G9050 Complex regional pain syndrome I, unspecified: Secondary | ICD-10-CM | POA: Diagnosis not present

## 2019-11-24 DIAGNOSIS — E669 Obesity, unspecified: Secondary | ICD-10-CM | POA: Diagnosis not present

## 2019-11-24 DIAGNOSIS — K760 Fatty (change of) liver, not elsewhere classified: Secondary | ICD-10-CM | POA: Diagnosis not present

## 2019-11-24 DIAGNOSIS — E039 Hypothyroidism, unspecified: Secondary | ICD-10-CM | POA: Diagnosis not present

## 2019-11-24 DIAGNOSIS — K581 Irritable bowel syndrome with constipation: Secondary | ICD-10-CM | POA: Diagnosis not present

## 2019-11-24 DIAGNOSIS — Z79890 Hormone replacement therapy (postmenopausal): Secondary | ICD-10-CM | POA: Diagnosis not present

## 2019-11-24 DIAGNOSIS — Z79899 Other long term (current) drug therapy: Secondary | ICD-10-CM | POA: Diagnosis not present

## 2019-11-24 DIAGNOSIS — K828 Other specified diseases of gallbladder: Secondary | ICD-10-CM | POA: Diagnosis not present

## 2019-11-24 DIAGNOSIS — Z8379 Family history of other diseases of the digestive system: Secondary | ICD-10-CM | POA: Diagnosis not present

## 2019-11-24 DIAGNOSIS — E785 Hyperlipidemia, unspecified: Secondary | ICD-10-CM | POA: Diagnosis not present

## 2019-11-24 DIAGNOSIS — K580 Irritable bowel syndrome with diarrhea: Secondary | ICD-10-CM | POA: Diagnosis not present

## 2019-11-24 LAB — COMPREHENSIVE METABOLIC PANEL
ALT: 241 U/L — ABNORMAL HIGH (ref 0–44)
AST: 87 U/L — ABNORMAL HIGH (ref 15–41)
Albumin: 3.3 g/dL — ABNORMAL LOW (ref 3.5–5.0)
Alkaline Phosphatase: 67 U/L (ref 38–126)
Anion gap: 9 (ref 5–15)
BUN: <5 mg/dL — ABNORMAL LOW (ref 6–20)
CO2: 22 mmol/L (ref 22–32)
Calcium: 8.5 mg/dL — ABNORMAL LOW (ref 8.9–10.3)
Chloride: 104 mmol/L (ref 98–111)
Creatinine, Ser: 0.67 mg/dL (ref 0.44–1.00)
GFR calc Af Amer: >60 mL/min (ref >60)
GFR calc non Af Amer: >60 mL/min (ref >60)
Glucose, Bld: 88 mg/dL (ref 70–99)
Potassium: 3.5 mmol/L (ref 3.5–5.1)
Sodium: 135 mmol/L (ref 135–145)
Total Bilirubin: 2.2 mg/dL — ABNORMAL HIGH (ref 0.3–1.2)
Total Protein: 6.2 g/dL — ABNORMAL LOW (ref 6.5–8.1)

## 2019-11-24 LAB — CBC
HCT: 42.7 % (ref 36.0–46.0)
Hemoglobin: 14.5 g/dL (ref 12.0–15.0)
MCH: 32 pg (ref 26.0–34.0)
MCHC: 34 g/dL (ref 30.0–36.0)
MCV: 94.3 fL (ref 80.0–100.0)
Platelets: 153 10*3/uL (ref 150–400)
RBC: 4.53 MIL/uL (ref 3.87–5.11)
RDW: 11.9 % (ref 11.5–15.5)
WBC: 7.7 10*3/uL (ref 4.0–10.5)
nRBC: 0 % (ref 0.0–0.2)

## 2019-11-24 SURGERY — LAPAROSCOPIC CHOLECYSTECTOMY WITH INTRAOPERATIVE CHOLANGIOGRAM
Anesthesia: Choice

## 2019-11-24 NOTE — Discharge Summary (Signed)
Physician Discharge Summary  Judith Long XLK:[id number] DOB: 09-16-1970 DOA: 11/22/2019  PCP: Joycelyn Rua, MD  Admit date: 11/22/2019 Discharge date: 11/24/2019  Admitted From: Home Disposition: Home  Recommendations for Outpatient Follow-up:  1. Follow up with PCP in 1-2 weeks 2. Follow with general surgery for laparoscopic cholecystectomy tomorrow 3. Please obtain BMP/CBC in one week 4. Please follow up with your PCP on the following pending results: Unresulted Labs (From admission, onward) Comment         None       Home Health: None Equipment/Devices: None  Discharge Condition: Stable CODE STATUS: Full code Diet recommendation: Regular for now but n.p.o. from midnight  Subjective: Seen and examined.  Feels much better.  No more abdominal pain or nausea.  Wants to go home.  HPI: Judith Long is a 49 y.o. female with medical history significant of IBS; gallstones; antiphospholipid syndrome; and hypothyroidism presenting with RUQ pain and nausea. She reports that she is having a "gallbladder rebellion."  Last weekend, she began having hot flashes, cold sweats, fever, and RUQ pain.  She has had periodic bloating for years but this was worse.  The pain was particularly bad Saturday and radiated into her back, making her feel like she was having a heart attack.  She felt like she passed the stone and the pain eased and she felt better on Sunday.  On Monday, she ate just a bite of something and the symptoms recurred.  +Nausea with eating.  Her urine turned dark and dense and "sticky."  Her stools became clay colored.   ED Course:   Concern for choledocholithiasis - [place] negative but stones in GB, elevated LFTs.  MRCP is ordered, surgery and GI to see.  Brief/Interim Summary: Patient was admitted under hospital service with abdominal pain.  She was diagnosed with cholelithiasis and there was some concern about possible choledocholithiasis.  She was seen by GI and general  surgery.  MRCP was done which did not show any choledocholithiasis but confirmed cholelithiasis.  No cholecystitis either.  Her LFTs were slightly elevated.  No surgery planned on doing laparoscopic cholecystectomy if LFTs improve and ERCP if they get worse however her LFTs improved so general surgery plan on doing laparoscopic cholecystectomy tomorrow on 11/25/2019 however since patient was feeling well so she requested general surgery to allow her to go home and show up tomorrow for laparoscopic cholecystectomy which surgery agreed and thus the cleared her for discharge.  Patient was discharged pain-free.  No fever.  General surgery did not recommend any antibiotics either.  Discharge Diagnoses:  Principal Problem:   Choledocholithiasis Active Problems:   Irritable bowel syndrome with both constipation and diarrhea   Antiphospholipid antibody syndrome (HCC)   RSD (reflex sympathetic dystrophy)   Hypothyroidism (acquired)   Obesity (BMI 30.0-34.9)    Discharge Instructions   Allergies as of 11/24/2019      Reactions   Toradol [ketorolac Tromethamine] Hives   Aspirin Rash   fever      Medication List    TAKE these medications   dicyclomine 10 MG capsule Commonly known as: BENTYL Take 10 mg by mouth 3 (three) times daily as needed for spasms.   IBUPROFEN PO Take 200 mg by mouth every 6 (six) hours as needed (pain).   levothyroxine 100 MCG tablet Commonly known as: SYNTHROID Take 100 mcg by mouth daily before breakfast.   rosuvastatin 5 MG tablet Commonly known as: CRESTOR Take 5 mg by mouth 3 (three) times a week.  Mon, Wed, Friday   Vitamin D 50 MCG (2000 UT) tablet Take 2,000 Units by mouth 2 (two) times daily.       Follow-up Information    Emelia Loron, MD Follow up in 1 day(s).   Specialty: General Surgery Why: [place] and/or our office will call you to let you know instructions for surgery tomorrow morning.  Nothing to eat or drink after midnight  tonight.   Contact information: [address] [place] [address] [phone]        Joycelyn Rua, MD Follow up.   Specialty: Family Medicine Contact information: [address] [place] [address] [phone]              Allergies  Allergen Reactions  . Toradol [Ketorolac Tromethamine] Hives  . Aspirin Rash    fever    Consultations: General surgery and GI   Procedures/Studies: MR 3D Recon At Scanner  Result Date: 11/23/2019 CLINICAL DATA:  Cholelithiasis EXAM: MRI ABDOMEN WITHOUT AND WITH CONTRAST (INCLUDING MRCP) TECHNIQUE: Multiplanar multisequence MR imaging of the abdomen was performed both before and after the administration of intravenous contrast. Heavily T2-weighted images of the biliary and pancreatic ducts were obtained, and three-dimensional MRCP images were rendered by post processing. CONTRAST:  7.19mL GADAVIST GADOBUTROL 1 MMOL/ML IV SOLN COMPARISON:  Ultrasound evaluation of the same date and CT study from 04/19/2015 FINDINGS: Lower chest: Incidental imaging of the lung bases is unremarkable. Hepatobiliary: Cholelithiasis and suspected adenomyomatosis of the gallbladder fundus. Area not well evaluated on today's study. Very mild thickening focally in the fundus of the gallbladder. Subtle cystic change suggested on T2 images. No pericholecystic stranding. No biliary ductal dilation or filling defect in the common bile duct. Marked hepatic steatosis as exhibited by profound signal loss on at of phase as compared to in phase T1 weighted gradient echo imaging. No focal, suspicious hepatic lesion with patent portal vein. Pancreas: Normal without focal lesion, ductal dilation or signs of inflammation. Spleen:  Spleen normal size without focal lesion. Adrenals/Urinary Tract:  Normal adrenal glands. Smooth renal contours. No hydronephrosis or perinephric edema. Yes or Stomach/Bowel: No acute gastrointestinal process to the extent evaluated. Study  not protocol for bowel evaluation. Pelvic bowel loops are not imaged. Vascular/Lymphatic: Vascular structures in the abdomen are patent. No sign of adenopathy. Other:  No ascites. Musculoskeletal: No acute or suspicious bone finding to the extent evaluated. IMPRESSION: 1. Cholelithiasis without choledocholithiasis, signs of cholecystitis or biliary obstruction. Profound hepatic steatosis may explain hepatic enzyme abnormalities. Potentially due to steatohepatitis. Hepatology consultation/follow-up may be helpful for clarification and to guide future follow-up. 2. Suspected adenomyomatosis of the gallbladder fundus. Focused evaluation on follow-up of this area with ultrasound may be helpful. Electronically Signed   By: Donzetta Kohut M.D.   On: 11/23/2019 12:18   MR ABDOMEN MRCP W WO CONTAST  Result Date: 11/23/2019 CLINICAL DATA:  Cholelithiasis EXAM: MRI ABDOMEN WITHOUT AND WITH CONTRAST (INCLUDING MRCP) TECHNIQUE: Multiplanar multisequence MR imaging of the abdomen was performed both before and after the administration of intravenous contrast. Heavily T2-weighted images of the biliary and pancreatic ducts were obtained, and three-dimensional MRCP images were rendered by post processing. CONTRAST:  7.51mL GADAVIST GADOBUTROL 1 MMOL/ML IV SOLN COMPARISON:  Ultrasound evaluation of the same date and CT study from 04/19/2015 FINDINGS: Lower chest: Incidental imaging of the lung bases is unremarkable. Hepatobiliary: Cholelithiasis and suspected adenomyomatosis of the gallbladder fundus. Area not well evaluated on today's study. Very mild thickening  focally in the fundus of the gallbladder. Subtle cystic change suggested on T2 images. No pericholecystic stranding. No biliary ductal dilation or filling defect in the common bile duct. Marked hepatic steatosis as exhibited by profound signal loss on at of phase as compared to in phase T1 weighted gradient echo imaging. No focal, suspicious hepatic lesion with patent  portal vein. Pancreas: Normal without focal lesion, ductal dilation or signs of inflammation. Spleen:  Spleen normal size without focal lesion. Adrenals/Urinary Tract:  Normal adrenal glands. Smooth renal contours. No hydronephrosis or perinephric edema. Yes or Stomach/Bowel: No acute gastrointestinal process to the extent evaluated. Study not protocol for bowel evaluation. Pelvic bowel loops are not imaged. Vascular/Lymphatic: Vascular structures in the abdomen are patent. No sign of adenopathy. Other:  No ascites. Musculoskeletal: No acute or suspicious bone finding to the extent evaluated. IMPRESSION: 1. Cholelithiasis without choledocholithiasis, signs of cholecystitis or biliary obstruction. Profound hepatic steatosis may explain hepatic enzyme abnormalities. Potentially due to steatohepatitis. Hepatology consultation/follow-up may be helpful for clarification and to guide future follow-up. 2. Suspected adenomyomatosis of the gallbladder fundus. Focused evaluation on follow-up of this area with ultrasound may be helpful. Electronically Signed   By: Donzetta Kohut M.D.   On: 11/23/2019 12:18   US Abdomen Limited RUQ  Result Date: 11/23/2019 CLINICAL DATA:  Acute right upper quadrant abdominal pain. EXAM: ULTRASOUND ABDOMEN LIMITED RIGHT UPPER QUADRANT COMPARISON:  April 19, 2015. FINDINGS: Gallbladder: Mild cholelithiasis is noted without gallbladder wall thickening or pericholecystic fluid. No sonographic Murphy's sign is noted. Common bile duct: Diameter: 5 mm which is within normal limits. Liver: No focal lesion identified. Increased echogenicity of hepatic parenchyma is noted suggesting hepatic steatosis. Portal vein is patent on color Doppler imaging with normal direction of blood flow towards the liver. Other: None. IMPRESSION: Mild cholelithiasis without evidence of cholecystitis. Probable hepatic steatosis. Electronically Signed   By: Lupita Raider M.D.   On: 11/23/2019 08:36      Discharge  Exam: Vitals:   11/23/19 2320 11/24/19 0550  BP: (!) 143/89 (!) 146/96  Pulse: 71 65  Resp: 18 18  Temp: 98.5 F (36.9 C) 98.3 F (36.8 C)  SpO2: 97% 98%   Vitals:   11/23/19 1508 11/23/19 1544 11/23/19 2320 11/24/19 0550  BP: 125/84 135/89 (!) 143/89 (!) 146/96  Pulse: 75  71 65  Resp: 17 18 18 18   Temp: 98.9 F (37.2 C) 97.9 F (36.6 C) 98.5 F (36.9 C) 98.3 F (36.8 C)  TempSrc: Oral Oral Oral Oral  SpO2: 98% 99% 97% 98%  Weight:      Height:        General: Pt is alert, awake, not in acute distress Cardiovascular: RRR, S1/S2 +, no rubs, no gallops Respiratory: CTA bilaterally, no wheezing, no rhonchi Abdominal: Soft,, ND, bowel sounds + Extremities: no edema, no cyanosis    The results of significant diagnostics from this hospitalization (including imaging, microbiology, ancillary and laboratory) are listed below for reference.     Microbiology: Recent Results (from the past 240 hour(s))  SARS Coronavirus 2 by RT PCR (hospital order, performed in [facility] hospital lab) Nasopharyngeal Nasopharyngeal Swab     Status: None   Collection Time: 11/23/19  9:52 AM   Specimen: Nasopharyngeal Swab  Result Value Ref Range Status   SARS Coronavirus 2 NEGATIVE NEGATIVE Final    Comment: (NOTE) SARS-CoV-2 target nucleic acids are NOT DETECTED.  The SARS-CoV-2 RNA is generally detectable in upper and lower respiratory specimens during  the acute phase of infection. The lowest concentration of SARS-CoV-2 viral copies this assay can detect is 250 copies / mL. A negative result does not preclude SARS-CoV-2 infection and should not be used as the sole basis for treatment or other patient management decisions.  A negative result may occur with improper specimen collection / handling, submission of specimen other than nasopharyngeal swab, presence of viral mutation(s) within the areas targeted by this assay, and inadequate number of viral copies (<250 copies / mL). A  negative result must be combined with clinical observations, patient history, and epidemiological information.  Fact Sheet for Patients:   BoilerBrush.com.cyhttps://www.fda.gov/media/136312/download  Fact Sheet for Healthcare Providers: https://pope.com/https://www.fda.gov/media/136313/download  This test is not yet approved or  cleared by the [place] FDA and has been authorized for detection and/or diagnosis of SARS-CoV-2 by FDA under an Emergency Use Authorization (EUA).  This EUA will remain in effect (meaning this test can be used) for the duration of the COVID-19 declaration under Section 564(b)(1) of the Act, 21 U.S.C. section 360bbb-3(b)(1), unless the authorization is terminated or revoked sooner.  Performed at [facility], [address] [place], [place] [id number]      Labs: BNP (last 3 results) No results for input(s): BNP in the last 8760 hours. Basic Metabolic Panel: Recent Labs  Lab 11/22/19 1907 11/24/19 0740  NA 139 135  K 4.3 3.5  CL 103 104  CO2 23 22  GLUCOSE 108* 88  BUN 6 <5*  CREATININE 0.76 0.67  CALCIUM 9.9 8.5*   Liver Function Tests: Recent Labs  Lab 11/22/19 1907 11/24/19 0740  AST 364* 87*  ALT 478* 241*  ALKPHOS 85 67  BILITOT 4.4* 2.2*  PROT 7.4 6.2*  ALBUMIN 4.2 3.3*   Recent Labs  Lab 11/22/19 1907  LIPASE 47   No results for input(s): AMMONIA in the last 168 hours. CBC: Recent Labs  Lab 11/22/19 1907 11/24/19 0740  WBC 9.1 7.7  HGB 16.1* 14.5  HCT 47.3* 42.7  MCV 95.0 94.3  PLT 174 153   Cardiac Enzymes: No results for input(s): CKTOTAL, CKMB, CKMBINDEX, TROPONINI in the last 168 hours. BNP: Invalid input(s): POCBNP CBG: No results for input(s): GLUCAP in the last 168 hours. D-Dimer No results for input(s): DDIMER in the last 72 hours. Hgb A1c No results for input(s): HGBA1C in the last 72 hours. Lipid Profile No results for input(s): CHOL, HDL, LDLCALC, TRIG, CHOLHDL, LDLDIRECT in the last 72 hours. Thyroid function  studies No results for input(s): TSH, T4TOTAL, T3FREE, THYROIDAB in the last 72 hours.  Invalid input(s): FREET3 Anemia work up No results for input(s): VITAMINB12, FOLATE, FERRITIN, TIBC, IRON, RETICCTPCT in the last 72 hours. Urinalysis    Component Value Date/Time   COLORURINE AMBER (A) 11/23/2019 1045   APPEARANCEUR HAZY (A) 11/23/2019 1045   LABSPEC 1.027 11/23/2019 1045   PHURINE 5.0 11/23/2019 1045   GLUCOSEU NEGATIVE 11/23/2019 1045   HGBUR NEGATIVE 11/23/2019 1045   BILIRUBINUR NEGATIVE 11/23/2019 1045   KETONESUR 80 (A) 11/23/2019 1045   PROTEINUR 30 (A) 11/23/2019 1045   UROBILINOGEN 1.0 04/19/2015 0910   NITRITE NEGATIVE 11/23/2019 1045   LEUKOCYTESUR SMALL (A) 11/23/2019 1045   Sepsis Labs Invalid input(s): PROCALCITONIN,  WBC,  LACTICIDVEN Microbiology Recent Results (from the past 240 hour(s))  SARS Coronavirus 2 by RT PCR (hospital order, performed in [facility] hospital lab) Nasopharyngeal Nasopharyngeal Swab     Status: None   Collection Time: 11/23/19  9:52 AM  Specimen: Nasopharyngeal Swab  Result Value Ref Range Status   SARS Coronavirus 2 NEGATIVE NEGATIVE Final    Comment: (NOTE) SARS-CoV-2 target nucleic acids are NOT DETECTED.  The SARS-CoV-2 RNA is generally detectable in upper and lower respiratory specimens during the acute phase of infection. The lowest concentration of SARS-CoV-2 viral copies this assay can detect is 250 copies / mL. A negative result does not preclude SARS-CoV-2 infection and should not be used as the sole basis for treatment or other patient management decisions.  A negative result may occur with improper specimen collection / handling, submission of specimen other than nasopharyngeal swab, presence of viral mutation(s) within the areas targeted by this assay, and inadequate number of viral copies (<250 copies / mL). A negative result must be combined with clinical observations, patient history, and epidemiological  information.  Fact Sheet for Patients:   StrictlyIdeas.no  Fact Sheet for Healthcare Providers: BankingDealers.co.za  This test is not yet approved or  cleared by the [place] FDA and has been authorized for detection and/or diagnosis of SARS-CoV-2 by FDA under an Emergency Use Authorization (EUA).  This EUA will remain in effect (meaning this test can be used) for the duration of the COVID-19 declaration under Section 564(b)(1) of the Act, 21 U.S.C. section 360bbb-3(b)(1), unless the authorization is terminated or revoked sooner.  Performed at [facility], [place] [address], [place], [place] [address]      Time coordinating discharge: Over 30 minutes  SIGNED:   Darliss Cheney, MD  [facility] 11/24/2019, 10:19 AM  If 7PM-7AM, please contact night-coverage [email or web address]

## 2019-11-24 NOTE — Discharge Instructions (Signed)
Nothing to eat or drink after midnight tonight.  If you have daily medications, you can take those with a sip of water as needed

## 2019-11-24 NOTE — Progress Notes (Addendum)
Spoke with Husband Judith Long and patient. Patient denies shortness of breath, fever, cough or chest pain.  PCP - Dr Joycelyn Rua Cardiologist - n/a  Chest x-ray - n/a EKG - 11/22/19 Stress Test - n/a ECHO - n/a Cardiac Cath - n/a  ERAS: Clears til 7 am DOS, no drink.  Anesthesia:  Spoke with Fayrene Fearing, [place] regarding patient's history. Hx RSD, Antiphospholipid antibody syndrome and Gilbert's syndrome.  STOP now taking any Aspirin (unless otherwise instructed by your surgeon), Aleve, Naproxen, Ibuprofen, Motrin, Advil, Goody's, BC's, all herbal medications, fish oil, and all vitamins.   Coronavirus Screening Covid test on 11/23/19 was negative.  Patient and husband verbalized understanding of instructions that were given via phone.

## 2019-11-24 NOTE — Progress Notes (Signed)
Day of Surgery  Subjective: Feels ok today.  Minimal discomfort.  Not taking any pain meds.  Tolerated liquids last night.  ROS: See above, otherwise other systems negative  Objective: Vital signs in last 24 hours: Temp:  [97.9 F (36.6 C)-98.9 F (37.2 C)] 98.3 F (36.8 C) (06/16 0550) Pulse Rate:  [65-94] 65 (06/16 0550) Resp:  [14-19] 18 (06/16 0550) BP: (111-146)/(59-96) 146/96 (06/16 0550) SpO2:  [95 %-99 %] 98 % (06/16 0550) Last BM Date: 11/22/19  Intake/Output from previous day: 06/15 0701 - 06/16 0700 In: 2279.7 [P.O.:240; I.V.:939.7; IV Piggyback:1100] Out: -  Intake/Output this shift: No intake/output data recorded.  PE: Heart: regular Lungs: CTAB Abd: soft, minimal RUQ tenderness, +BS, ND  Lab Results:  Recent Labs    11/22/19 1907 11/24/19 0740  WBC 9.1 7.7  HGB 16.1* 14.5  HCT 47.3* 42.7  PLT 174 153   BMET Recent Labs    11/22/19 1907 11/24/19 0740  NA 139 135  K 4.3 3.5  CL 103 104  CO2 23 22  GLUCOSE 108* 88  BUN 6 <5*  CREATININE 0.76 0.67  CALCIUM 9.9 8.5*   PT/INR No results for input(s): LABPROT, INR in the last 72 hours. CMP     Component Value Date/Time   NA 135 11/24/2019 0740   K 3.5 11/24/2019 0740   CL 104 11/24/2019 0740   CO2 22 11/24/2019 0740   GLUCOSE 88 11/24/2019 0740   BUN <5 (L) 11/24/2019 0740   CREATININE 0.67 11/24/2019 0740   CALCIUM 8.5 (L) 11/24/2019 0740   PROT 6.2 (L) 11/24/2019 0740   ALBUMIN 3.3 (L) 11/24/2019 0740   AST 87 (H) 11/24/2019 0740   ALT 241 (H) 11/24/2019 0740   ALKPHOS 67 11/24/2019 0740   BILITOT 2.2 (H) 11/24/2019 0740   GFRNONAA >60 11/24/2019 0740   GFRAA >60 11/24/2019 0740   Lipase     Component Value Date/Time   LIPASE 47 11/22/2019 1907       Studies/Results: MR 3D Recon At Scanner  Result Date: 11/23/2019 CLINICAL DATA:  Cholelithiasis EXAM: MRI ABDOMEN WITHOUT AND WITH CONTRAST (INCLUDING MRCP) TECHNIQUE: Multiplanar multisequence MR imaging of the  abdomen was performed both before and after the administration of intravenous contrast. Heavily T2-weighted images of the biliary and pancreatic ducts were obtained, and three-dimensional MRCP images were rendered by post processing. CONTRAST:  7.75mL GADAVIST GADOBUTROL 1 MMOL/ML IV SOLN COMPARISON:  Ultrasound evaluation of the same date and CT study from 04/19/2015 FINDINGS: Lower chest: Incidental imaging of the lung bases is unremarkable. Hepatobiliary: Cholelithiasis and suspected adenomyomatosis of the gallbladder fundus. Area not well evaluated on today's study. Very mild thickening focally in the fundus of the gallbladder. Subtle cystic change suggested on T2 images. No pericholecystic stranding. No biliary ductal dilation or filling defect in the common bile duct. Marked hepatic steatosis as exhibited by profound signal loss on at of phase as compared to in phase T1 weighted gradient echo imaging. No focal, suspicious hepatic lesion with patent portal vein. Pancreas: Normal without focal lesion, ductal dilation or signs of inflammation. Spleen:  Spleen normal size without focal lesion. Adrenals/Urinary Tract:  Normal adrenal glands. Smooth renal contours. No hydronephrosis or perinephric edema. Yes or Stomach/Bowel: No acute gastrointestinal process to the extent evaluated. Study not protocol for bowel evaluation. Pelvic bowel loops are not imaged. Vascular/Lymphatic: Vascular structures in the abdomen are patent. No sign of adenopathy. Other:  No ascites. Musculoskeletal: No acute or suspicious bone finding  to the extent evaluated. IMPRESSION: 1. Cholelithiasis without choledocholithiasis, signs of cholecystitis or biliary obstruction. Profound hepatic steatosis may explain hepatic enzyme abnormalities. Potentially due to steatohepatitis. Hepatology consultation/follow-up may be helpful for clarification and to guide future follow-up. 2. Suspected adenomyomatosis of the gallbladder fundus. Focused  evaluation on follow-up of this area with ultrasound may be helpful. Electronically Signed   By: Zetta Bills M.D.   On: 11/23/2019 12:18   MR ABDOMEN MRCP W WO CONTAST  Result Date: 11/23/2019 CLINICAL DATA:  Cholelithiasis EXAM: MRI ABDOMEN WITHOUT AND WITH CONTRAST (INCLUDING MRCP) TECHNIQUE: Multiplanar multisequence MR imaging of the abdomen was performed both before and after the administration of intravenous contrast. Heavily T2-weighted images of the biliary and pancreatic ducts were obtained, and three-dimensional MRCP images were rendered by post processing. CONTRAST:  7.66mL GADAVIST GADOBUTROL 1 MMOL/ML IV SOLN COMPARISON:  Ultrasound evaluation of the same date and CT study from 04/19/2015 FINDINGS: Lower chest: Incidental imaging of the lung bases is unremarkable. Hepatobiliary: Cholelithiasis and suspected adenomyomatosis of the gallbladder fundus. Area not well evaluated on today's study. Very mild thickening focally in the fundus of the gallbladder. Subtle cystic change suggested on T2 images. No pericholecystic stranding. No biliary ductal dilation or filling defect in the common bile duct. Marked hepatic steatosis as exhibited by profound signal loss on at of phase as compared to in phase T1 weighted gradient echo imaging. No focal, suspicious hepatic lesion with patent portal vein. Pancreas: Normal without focal lesion, ductal dilation or signs of inflammation. Spleen:  Spleen normal size without focal lesion. Adrenals/Urinary Tract:  Normal adrenal glands. Smooth renal contours. No hydronephrosis or perinephric edema. Yes or Stomach/Bowel: No acute gastrointestinal process to the extent evaluated. Study not protocol for bowel evaluation. Pelvic bowel loops are not imaged. Vascular/Lymphatic: Vascular structures in the abdomen are patent. No sign of adenopathy. Other:  No ascites. Musculoskeletal: No acute or suspicious bone finding to the extent evaluated. IMPRESSION: 1. Cholelithiasis  without choledocholithiasis, signs of cholecystitis or biliary obstruction. Profound hepatic steatosis may explain hepatic enzyme abnormalities. Potentially due to steatohepatitis. Hepatology consultation/follow-up may be helpful for clarification and to guide future follow-up. 2. Suspected adenomyomatosis of the gallbladder fundus. Focused evaluation on follow-up of this area with ultrasound may be helpful. Electronically Signed   By: Zetta Bills M.D.   On: 11/23/2019 12:18   US Abdomen Limited RUQ  Result Date: 11/23/2019 CLINICAL DATA:  Acute right upper quadrant abdominal pain. EXAM: ULTRASOUND ABDOMEN LIMITED RIGHT UPPER QUADRANT COMPARISON:  April 19, 2015. FINDINGS: Gallbladder: Mild cholelithiasis is noted without gallbladder wall thickening or pericholecystic fluid. No sonographic Murphy's sign is noted. Common bile duct: Diameter: 5 mm which is within normal limits. Liver: No focal lesion identified. Increased echogenicity of hepatic parenchyma is noted suggesting hepatic steatosis. Portal vein is patent on color Doppler imaging with normal direction of blood flow towards the liver. Other: None. IMPRESSION: Mild cholelithiasis without evidence of cholecystitis. Probable hepatic steatosis. Electronically Signed   By: Marijo Conception M.D.   On: 11/23/2019 08:36    Anti-infectives: Anti-infectives (From admission, onward)   Start     Dose/Rate Route Frequency Ordered Stop   11/23/19 1415  cefTRIAXone (ROCEPHIN) 2 g in sodium chloride 0.9 % 100 mL IVPB     Discontinue     2 g 200 mL/hr over 30 Minutes Intravenous Every 24 hours 11/23/19 1403         Assessment/Plan Symptomatic cholelithiasis Elevated LFTs hyperbilirubinemia  - MRCP was negative  for choledocholithiasis - suspect that patient may have passed a stone via CBD and may have actually had some degree of cholangitis - repeat LFTs today show TB down to 2.2 from 4.4. - given no OR time availability today and patient  improving, gave option to stay or go home and come back tomorrow as an outpatient for lap chole.  She chose to go home.  I have called our office and she is already being scheduled for surgery tomorrow am. -D/W primary service as well. -she understands she needs to be NPO after MN  FEN: diet ok VTE: SCDs ID: [id number] 6/15>>   LOS: 1 day    Letha Cape , [facility] 11/24/2019, 9:48 AM Please see Amion for pager number during day hours 7:00am-4:30pm or 7:00am -11:30am on weekends

## 2019-11-24 NOTE — Progress Notes (Signed)
Pt given discharge summary and discharged via husband as transportation. °

## 2019-11-25 ENCOUNTER — Ambulatory Visit (HOSPITAL_COMMUNITY): Payer: BC Managed Care – PPO | Admitting: Physician Assistant

## 2019-11-25 ENCOUNTER — Encounter (HOSPITAL_COMMUNITY)
Admission: RE | Disposition: A | Discharge status: Home / Self Care | Payer: Self-pay | Source: Home / Self Care | Attending: General Surgery

## 2019-11-25 ENCOUNTER — Other Ambulatory Visit: Payer: Self-pay

## 2019-11-25 ENCOUNTER — Ambulatory Visit (HOSPITAL_COMMUNITY)
Admission: RE | Admit: 2019-11-25 | Discharge: 2019-11-25 | Disposition: A | Discharge status: Home / Self Care | Payer: BC Managed Care – PPO | Attending: General Surgery | Admitting: General Surgery

## 2019-11-25 ENCOUNTER — Ambulatory Visit (HOSPITAL_COMMUNITY): Payer: BC Managed Care – PPO

## 2019-11-25 ENCOUNTER — Encounter (HOSPITAL_COMMUNITY): Payer: Self-pay | Admitting: General Surgery

## 2019-11-25 DIAGNOSIS — K8050 Calculus of bile duct without cholangitis or cholecystitis without obstruction: Secondary | ICD-10-CM | POA: Diagnosis not present

## 2019-11-25 DIAGNOSIS — K828 Other specified diseases of gallbladder: Secondary | ICD-10-CM | POA: Diagnosis not present

## 2019-11-25 DIAGNOSIS — D6861 Antiphospholipid syndrome: Secondary | ICD-10-CM | POA: Diagnosis not present

## 2019-11-25 DIAGNOSIS — G8918 Other acute postprocedural pain: Secondary | ICD-10-CM | POA: Diagnosis not present

## 2019-11-25 DIAGNOSIS — Z6831 Body mass index (BMI) 31.0-31.9, adult: Secondary | ICD-10-CM | POA: Insufficient documentation

## 2019-11-25 DIAGNOSIS — Z800 Family history of malignant neoplasm of digestive organs: Secondary | ICD-10-CM | POA: Insufficient documentation

## 2019-11-25 DIAGNOSIS — K8070 Calculus of gallbladder and bile duct without cholecystitis without obstruction: Secondary | ICD-10-CM | POA: Diagnosis not present

## 2019-11-25 DIAGNOSIS — E669 Obesity, unspecified: Secondary | ICD-10-CM | POA: Diagnosis not present

## 2019-11-25 DIAGNOSIS — Z8379 Family history of other diseases of the digestive system: Secondary | ICD-10-CM | POA: Diagnosis not present

## 2019-11-25 DIAGNOSIS — K581 Irritable bowel syndrome with constipation: Secondary | ICD-10-CM | POA: Diagnosis not present

## 2019-11-25 DIAGNOSIS — E039 Hypothyroidism, unspecified: Secondary | ICD-10-CM | POA: Diagnosis not present

## 2019-11-25 DIAGNOSIS — K8010 Calculus of gallbladder with chronic cholecystitis without obstruction: Secondary | ICD-10-CM | POA: Insufficient documentation

## 2019-11-25 DIAGNOSIS — R100 Acute abdomen: Secondary | ICD-10-CM

## 2019-11-25 HISTORY — DX: Gilbert syndrome: E80.4

## 2019-11-25 HISTORY — DX: Recurrent pregnancy loss: N96

## 2019-11-25 HISTORY — PX: CHOLECYSTECTOMY: SHX55

## 2019-11-25 HISTORY — DX: Hyperlipidemia, unspecified: E78.5

## 2019-11-25 HISTORY — PX: INTRAOPERATIVE CHOLANGIOGRAM: SHX5230

## 2019-11-25 SURGERY — LAPAROSCOPIC CHOLECYSTECTOMY WITH INTRAOPERATIVE CHOLANGIOGRAM
Anesthesia: General | Site: Abdomen

## 2019-11-25 MED ORDER — ONDANSETRON HCL 4 MG/2ML IJ SOLN
INTRAMUSCULAR | Status: DC | PRN
  Administered 2019-11-25: 4 mg via INTRAVENOUS

## 2019-11-25 MED ORDER — FENTANYL CITRATE (PF) 100 MCG/2ML IJ SOLN
25.0000 ug | INTRAMUSCULAR | Status: DC | PRN
  Administered 2019-11-25 (×2): 50 ug via INTRAVENOUS

## 2019-11-25 MED ORDER — FENTANYL CITRATE (PF) 250 MCG/5ML IJ SOLN
INTRAMUSCULAR | Status: AC
  Filled 2019-11-25: qty 5

## 2019-11-25 MED ORDER — MIDAZOLAM HCL 2 MG/2ML IJ SOLN
INTRAMUSCULAR | Status: AC
  Administered 2019-11-25: 2 mg via INTRAVENOUS
  Filled 2019-11-25: qty 2

## 2019-11-25 MED ORDER — OXYCODONE HCL 5 MG/5ML PO SOLN: 5.0000 mg | Freq: Once | ORAL | Status: AC | PRN

## 2019-11-25 MED ORDER — DEXAMETHASONE SODIUM PHOSPHATE 10 MG/ML IJ SOLN
INTRAMUSCULAR | Status: DC | PRN
Start: 2019-11-25 — End: 2019-11-25
  Administered 2019-11-25: 10 mg via INTRAVENOUS

## 2019-11-25 MED ORDER — CHLORHEXIDINE GLUCONATE 0.12 % MT SOLN: 15.0000 mL | Freq: Once | OROMUCOSAL | Status: AC

## 2019-11-25 MED ORDER — PROPOFOL 10 MG/ML IV BOLUS
INTRAVENOUS | Status: AC
  Filled 2019-11-25: qty 20

## 2019-11-25 MED ORDER — ROCURONIUM BROMIDE 10 MG/ML (PF) SYRINGE
PREFILLED_SYRINGE | INTRAVENOUS | Status: AC
  Filled 2019-11-25: qty 10

## 2019-11-25 MED ORDER — CEFAZOLIN SODIUM-DEXTROSE 2-4 GM/100ML-% IV SOLN
2.0000 g | INTRAVENOUS | Status: AC
  Administered 2019-11-25: 2 g via INTRAVENOUS

## 2019-11-25 MED ORDER — SODIUM CHLORIDE 0.9 % IV SOLN: 250.0000 mL | INTRAVENOUS | Status: DC | PRN

## 2019-11-25 MED ORDER — FENTANYL CITRATE (PF) 100 MCG/2ML IJ SOLN: 50.0000 ug | Freq: Once | INTRAMUSCULAR | Status: AC

## 2019-11-25 MED ORDER — INDOCYANINE GREEN 25 MG IV SOLR
INTRAVENOUS | Status: DC | PRN
Start: 2019-11-25 — End: 2019-11-25
  Administered 2019-11-25: 2.5 mg via INTRAVENOUS

## 2019-11-25 MED ORDER — MIDAZOLAM HCL 2 MG/2ML IJ SOLN: 2.0000 mg | Freq: Once | INTRAMUSCULAR | Status: AC

## 2019-11-25 MED ORDER — LIDOCAINE 2% (20 MG/ML) 5 ML SYRINGE
INTRAMUSCULAR | Status: DC | PRN
  Administered 2019-11-25: 60 mg via INTRAVENOUS

## 2019-11-25 MED ORDER — MIDAZOLAM HCL 2 MG/2ML IJ SOLN
INTRAMUSCULAR | Status: AC
  Filled 2019-11-25: qty 2

## 2019-11-25 MED ORDER — PROPOFOL 10 MG/ML IV BOLUS
INTRAVENOUS | Status: DC | PRN
  Administered 2019-11-25: 160 mg via INTRAVENOUS

## 2019-11-25 MED ORDER — GABAPENTIN 100 MG PO CAPS
ORAL_CAPSULE | ORAL | Status: AC
  Administered 2019-11-25: 100 mg via ORAL
  Filled 2019-11-25: qty 1

## 2019-11-25 MED ORDER — BUPIVACAINE-EPINEPHRINE (PF) 0.5% -1:200000 IJ SOLN
INTRAMUSCULAR | Status: DC | PRN
Start: 2019-11-25 — End: 2019-11-25
  Administered 2019-11-25 (×2): 20 mL via PERINEURAL

## 2019-11-25 MED ORDER — LIDOCAINE 2% (20 MG/ML) 5 ML SYRINGE
INTRAMUSCULAR | Status: AC
  Filled 2019-11-25: qty 5

## 2019-11-25 MED ORDER — GABAPENTIN 100 MG PO CAPS: 100.0000 mg | ORAL_CAPSULE | ORAL | Status: AC

## 2019-11-25 MED ORDER — BUPIVACAINE HCL (PF) 0.25 % IJ SOLN
INTRAMUSCULAR | Status: AC
  Filled 2019-11-25: qty 30

## 2019-11-25 MED ORDER — 0.9 % SODIUM CHLORIDE (POUR BTL) OPTIME
TOPICAL | Status: DC | PRN
  Administered 2019-11-25: 1000 mL

## 2019-11-25 MED ORDER — SODIUM CHLORIDE 0.9% FLUSH: 3.0000 mL | Freq: Two times a day (BID) | INTRAVENOUS | Status: DC

## 2019-11-25 MED ORDER — SODIUM CHLORIDE 0.9% FLUSH: 3.0000 mL | INTRAVENOUS | Status: DC | PRN

## 2019-11-25 MED ORDER — FENTANYL CITRATE (PF) 100 MCG/2ML IJ SOLN
INTRAMUSCULAR | Status: AC
  Administered 2019-11-25: 50 ug via INTRAVENOUS
  Filled 2019-11-25: qty 2

## 2019-11-25 MED ORDER — ORAL CARE MOUTH RINSE: 15.0000 mL | Freq: Once | OROMUCOSAL | Status: AC

## 2019-11-25 MED ORDER — FENTANYL CITRATE (PF) 100 MCG/2ML IJ SOLN
INTRAMUSCULAR | Status: DC | PRN
  Administered 2019-11-25: 150 ug via INTRAVENOUS
  Administered 2019-11-25: 100 ug via INTRAVENOUS

## 2019-11-25 MED ORDER — ACETAMINOPHEN 650 MG RE SUPP: 650.0000 mg | RECTAL | Status: DC | PRN

## 2019-11-25 MED ORDER — SODIUM CHLORIDE 0.9 % IV SOLN: INTRAVENOUS | Status: DC | PRN

## 2019-11-25 MED ORDER — ONDANSETRON HCL 4 MG/2ML IJ SOLN: 4.0000 mg | Freq: Four times a day (QID) | INTRAMUSCULAR | Status: DC | PRN

## 2019-11-25 MED ORDER — OXYCODONE HCL 5 MG PO TABS: 5.0000 mg | ORAL_TABLET | Freq: Four times a day (QID) | ORAL | 0 refills | Status: AC | PRN

## 2019-11-25 MED ORDER — ACETAMINOPHEN 500 MG PO TABS
ORAL_TABLET | ORAL | Status: AC
  Administered 2019-11-25: 1000 mg via ORAL
  Filled 2019-11-25: qty 2

## 2019-11-25 MED ORDER — SODIUM CHLORIDE 0.9 % IR SOLN
Status: DC | PRN
  Administered 2019-11-25: 1

## 2019-11-25 MED ORDER — SODIUM CHLORIDE 0.9 % IV SOLN: INTRAVENOUS | Status: DC

## 2019-11-25 MED ORDER — MORPHINE SULFATE (PF) 2 MG/ML IV SOLN
1.0000 mg | INTRAVENOUS | Status: DC | PRN
Start: 2019-11-25 — End: 2019-11-25

## 2019-11-25 MED ORDER — FENTANYL CITRATE (PF) 100 MCG/2ML IJ SOLN
INTRAMUSCULAR | Status: AC
  Filled 2019-11-25: qty 2

## 2019-11-25 MED ORDER — BUPIVACAINE-EPINEPHRINE 0.25% -1:200000 IJ SOLN
INTRAMUSCULAR | Status: DC | PRN
  Administered 2019-11-25: 12 mL

## 2019-11-25 MED ORDER — OXYCODONE HCL 5 MG PO TABS
ORAL_TABLET | ORAL | Status: AC
  Filled 2019-11-25: qty 1

## 2019-11-25 MED ORDER — ACETAMINOPHEN 325 MG PO TABS
650.0000 mg | ORAL_TABLET | ORAL | Status: DC | PRN
Start: 2019-11-25 — End: 2019-11-25

## 2019-11-25 MED ORDER — DEXAMETHASONE SODIUM PHOSPHATE 10 MG/ML IJ SOLN
INTRAMUSCULAR | Status: AC
  Filled 2019-11-25: qty 1

## 2019-11-25 MED ORDER — ACETAMINOPHEN 500 MG PO TABS: 1000.0000 mg | ORAL_TABLET | ORAL | Status: AC

## 2019-11-25 MED ORDER — OXYCODONE HCL 5 MG PO TABS
5.0000 mg | ORAL_TABLET | ORAL | Status: DC | PRN
Start: 2019-11-25 — End: 2019-11-25

## 2019-11-25 MED ORDER — CEFAZOLIN SODIUM-DEXTROSE 2-4 GM/100ML-% IV SOLN
INTRAVENOUS | Status: AC
  Filled 2019-11-25: qty 100

## 2019-11-25 MED ORDER — ONDANSETRON HCL 4 MG/2ML IJ SOLN
INTRAMUSCULAR | Status: AC
  Filled 2019-11-25: qty 2

## 2019-11-25 MED ORDER — SUGAMMADEX SODIUM 200 MG/2ML IV SOLN
INTRAVENOUS | Status: DC | PRN
  Administered 2019-11-25: 300 mg via INTRAVENOUS

## 2019-11-25 MED ORDER — CHLORHEXIDINE GLUCONATE 0.12 % MT SOLN
OROMUCOSAL | Status: AC
  Administered 2019-11-25: 15 mL via OROMUCOSAL
  Filled 2019-11-25: qty 15

## 2019-11-25 MED ORDER — DEXMEDETOMIDINE HCL IN NACL 200 MCG/50ML IV SOLN
INTRAVENOUS | Status: DC | PRN
Start: 2019-11-25 — End: 2019-11-25
  Administered 2019-11-25: 20 ug via INTRAVENOUS

## 2019-11-25 MED ORDER — OXYCODONE HCL 5 MG PO TABS
5.0000 mg | ORAL_TABLET | Freq: Once | ORAL | Status: AC | PRN
  Administered 2019-11-25: 5 mg via ORAL

## 2019-11-25 MED ORDER — ROCURONIUM BROMIDE 10 MG/ML (PF) SYRINGE
PREFILLED_SYRINGE | INTRAVENOUS | Status: DC | PRN
  Administered 2019-11-25: 50 mg via INTRAVENOUS

## 2019-11-25 MED ORDER — LACTATED RINGERS IV SOLN: INTRAVENOUS | Status: DC | PRN

## 2019-11-25 SURGICAL SUPPLY — 42 items
APPLIER CLIP 5 13 M/L LIGAMAX5 (MISCELLANEOUS) ×3
BLADE CLIPPER SURG (BLADE) IMPLANT
CANISTER SUCT 3000ML PPV (MISCELLANEOUS) ×3 IMPLANT
CHLORAPREP W/TINT 26 (MISCELLANEOUS) ×3 IMPLANT
CLIP APPLIE 5 13 M/L LIGAMAX5 (MISCELLANEOUS) ×1 IMPLANT
CLOSURE STERI-STRIP 1/4X4 (GAUZE/BANDAGES/DRESSINGS) ×3 IMPLANT
CLOSURE WOUND 1/2 X4 (GAUZE/BANDAGES/DRESSINGS) ×1
COVER MAYO STAND STRL (DRAPES) ×3 IMPLANT
COVER SURGICAL LIGHT HANDLE (MISCELLANEOUS) ×3 IMPLANT
COVER WAND RF STERILE (DRAPES) ×3 IMPLANT
DERMABOND ADVANCED (GAUZE/BANDAGES/DRESSINGS) ×2
DERMABOND ADVANCED .7 DNX12 (GAUZE/BANDAGES/DRESSINGS) ×1 IMPLANT
DRAPE C-ARM 42X120 X-RAY (DRAPES) ×3 IMPLANT
ELECT REM PT RETURN 9FT ADLT (ELECTROSURGICAL) ×3
ELECTRODE REM PT RTRN 9FT ADLT (ELECTROSURGICAL) ×1 IMPLANT
GLOVE BIO SURGEON STRL SZ7 (GLOVE) ×3 IMPLANT
GLOVE BIOGEL PI IND STRL 7.5 (GLOVE) ×1 IMPLANT
GLOVE BIOGEL PI INDICATOR 7.5 (GLOVE) ×2
GOWN STRL REUS W/ TWL LRG LVL3 (GOWN DISPOSABLE) ×3 IMPLANT
GOWN STRL REUS W/TWL LRG LVL3 (GOWN DISPOSABLE) ×6
GRASPER SUT TROCAR 14GX15 (MISCELLANEOUS) ×3 IMPLANT
KIT BASIN OR (CUSTOM PROCEDURE TRAY) ×3 IMPLANT
KIT TURNOVER KIT B (KITS) ×3 IMPLANT
NS IRRIG 1000ML POUR BTL (IV SOLUTION) ×3 IMPLANT
PAD ARMBOARD 7.5X6 YLW CONV (MISCELLANEOUS) ×3 IMPLANT
POUCH RETRIEVAL ECOSAC 10 (ENDOMECHANICALS) ×1 IMPLANT
POUCH RETRIEVAL ECOSAC 10MM (ENDOMECHANICALS) ×2
SCISSORS LAP 5X35 DISP (ENDOMECHANICALS) ×3 IMPLANT
SET CHOLANGIOGRAPH 5 50 .035 (SET/KITS/TRAYS/PACK) ×3 IMPLANT
SET IRRIG TUBING LAPAROSCOPIC (IRRIGATION / IRRIGATOR) ×3 IMPLANT
SET TUBE SMOKE EVAC HIGH FLOW (TUBING) ×3 IMPLANT
SLEEVE ENDOPATH XCEL 5M (ENDOMECHANICALS) ×6 IMPLANT
SPECIMEN JAR SMALL (MISCELLANEOUS) ×3 IMPLANT
STRIP CLOSURE SKIN 1/2X4 (GAUZE/BANDAGES/DRESSINGS) ×2 IMPLANT
SUT MNCRL AB 4-0 PS2 18 (SUTURE) ×3 IMPLANT
SUT VICRYL 0 UR6 27IN ABS (SUTURE) ×6 IMPLANT
TOWEL GREEN STERILE (TOWEL DISPOSABLE) ×3 IMPLANT
TOWEL GREEN STERILE FF (TOWEL DISPOSABLE) ×3 IMPLANT
TRAY LAPAROSCOPIC MC (CUSTOM PROCEDURE TRAY) ×3 IMPLANT
TROCAR XCEL BLUNT TIP 100MML (ENDOMECHANICALS) ×3 IMPLANT
TROCAR XCEL NON-BLD 5MMX100MML (ENDOMECHANICALS) ×3 IMPLANT
WATER STERILE IRR 1000ML POUR (IV SOLUTION) ×3 IMPLANT

## 2019-11-25 NOTE — Discharge Instructions (Signed)
[facility]  Please arrive at least 30 min before your appointment to complete your check in paperwork.  If you are unable to arrive 30 min prior to your appointment time we may have to cancel or reschedule you. LAPAROSCOPIC SURGERY: POST OP INSTRUCTIONS Always review your discharge instruction sheet given to you by the facility where your surgery was performed. IF YOU HAVE DISABILITY OR FAMILY LEAVE FORMS, YOU MUST BRING THEM TO THE OFFICE FOR PROCESSING.   DO NOT GIVE THEM TO YOUR DOCTOR.  PAIN CONTROL  1. First take acetaminophen (Tylenol) AND/or ibuprofen (Advil) to control your pain after surgery.  Follow directions on package.  Taking acetaminophen (Tylenol) and/or ibuprofen (Advil) regularly after surgery will help to control your pain and lower the amount of prescription pain medication you may need.  You should not take more than 4,000 mg (4 grams) of acetaminophen (Tylenol) in 24 hours.  You should not take ibuprofen (Advil), aleve, motrin, naprosyn or other NSAIDS if you have a history of stomach ulcers or chronic kidney disease.  2. A prescription for pain medication may be given to you upon discharge.  Take your pain medication as prescribed, if you still have uncontrolled pain after taking acetaminophen (Tylenol) or ibuprofen (Advil). 3. Use ice packs to help control pain. 4. If you need a refill on your pain medication, please contact your pharmacy.  They will contact our office to request authorization. Prescriptions will not be filled after 5pm or on week-ends.  HOME MEDICATIONS 5. Take your usually prescribed medications unless otherwise directed.  DIET 6. You should follow a light diet the first few days after arrival home.  Be sure to include lots of fluids daily. Avoid fatty, fried foods.   CONSTIPATION 7. It is common to experience some constipation after surgery and if you are taking pain medication.  Increasing fluid intake and taking a stool  softener (such as Colace) will usually help or prevent this problem from occurring.  A mild laxative (Milk of Magnesia or Miralax) should be taken according to package instructions if there are no bowel movements after 48 hours.  WOUND/INCISION CARE 8. Most patients will experience some swelling and bruising in the area of the incisions.  Ice packs will help.  Swelling and bruising can take several days to resolve.  9. Unless discharge instructions indicate otherwise, follow guidelines below  a. STERI-STRIPS - you may remove your outer bandages 48 hours after surgery, and you may shower at that time.  You have steri-strips (small skin tapes) in place directly over the incision.  These strips should be left on the skin for 7-10 days.   b. DERMABOND/SKIN GLUE - you may shower in 24 hours.  The glue will flake off over the next 2-3 weeks. 10. Any sutures or staples will be removed at the office during your follow-up visit.  ACTIVITIES 11. You may resume regular (light) daily activities beginning the next day--such as daily self-care, walking, climbing stairs--gradually increasing activities as tolerated.  You may have sexual intercourse when it is comfortable.  Refrain from any heavy lifting or straining until approved by your doctor. a. You may drive when you are no longer taking prescription pain medication, you can comfortably wear a seatbelt, and you can safely maneuver your car and apply brakes.  FOLLOW-UP 12. You should see your doctor in the office for a follow-up appointment approximately 2-3 weeks after your surgery.  You should have been given your post-op/follow-up appointment when   your surgery was scheduled.  If you did not receive a post-op/follow-up appointment, make sure that you call for this appointment within a day or two after you arrive home to insure a convenient appointment time.   WHEN TO CALL YOUR DOCTOR: 1. Fever over 101.0 2. Inability to urinate 3. Continued bleeding from  incision. 4. Increased pain, redness, or drainage from the incision. 5. Increasing abdominal pain  The clinic staff is available to answer your questions during regular business hours.  Please don't hesitate to call and ask to speak to one of the nurses for clinical concerns.  If you have a medical emergency, go to the nearest emergency room or call 911.  A surgeon from [facility] is always on call at the hospital. [address], [address], [place], [place]  [address] ? [address] [address], [place], [place]   [address] [phone] ? [phone] ? FAX [phone]  .........   Managing Your Pain After Surgery Without Opioids    Thank you for participating in our program to help patients manage their pain after surgery without opioids. This is part of our effort to provide you with the best care possible, without exposing you or your family to the risk that opioids pose.  What pain can I expect after surgery? You can expect to have some pain after surgery. This is normal. The pain is typically worse the day after surgery, and quickly begins to get better. Many studies have found that many patients are able to manage their pain after surgery with Over-the-Counter (OTC) medications such as Tylenol and Motrin. If you have a condition that does not allow you to take Tylenol or Motrin, notify your surgical team.  How will I manage my pain? The best strategy for controlling your pain after surgery is around the clock pain control with Tylenol (acetaminophen) and Motrin (ibuprofen or Advil). Alternating these medications with each other allows you to maximize your pain control. In addition to Tylenol and Motrin, you can use heating pads or ice packs on your incisions to help reduce your pain.  How will I alternate your regular strength over-the-counter pain medication? You will take a dose of pain medication every three hours. ; Start by taking 650 mg of Tylenol (2 pills of 325  mg) ; 3 hours later take 600 mg of Motrin (3 pills of 200 mg) ; 3 hours after taking the Motrin take 650 mg of Tylenol ; 3 hours after that take 600 mg of Motrin.   - 1 -  See example - if your first dose of Tylenol is at 12:00 PM   12:00 PM Tylenol 650 mg (2 pills of 325 mg)  3:00 PM Motrin 600 mg (3 pills of 200 mg)  6:00 PM Tylenol 650 mg (2 pills of 325 mg)  9:00 PM Motrin 600 mg (3 pills of 200 mg)  Continue alternating every 3 hours   We recommend that you follow this schedule around-the-clock for at least 3 days after surgery, or until you feel that it is no longer needed. Use the table on the last page of this handout to keep track of the medications you are taking. Important: Do not take more than 3000mg of Tylenol or 3200mg of Motrin in a 24-hour period. Do not take ibuprofen/Motrin if you have a history of bleeding stomach ulcers, severe kidney disease, &/or actively taking a blood thinner  What if I still have pain? If you have pain that is not   controlled with the over-the-counter pain medications (Tylenol and Motrin or Advil) you might have what we call "breakthrough" pain. You will receive a prescription for a small amount of an opioid pain medication such as Oxycodone, Tramadol, or Tylenol with Codeine. Use these opioid pills in the first 24 hours after surgery if you have breakthrough pain. Do not take more than 1 pill every 4-6 hours.  If you still have uncontrolled pain after using all opioid pills, don't hesitate to call our staff using the number provided. We will help make sure you are managing your pain in the best way possible, and if necessary, we can provide a prescription for additional pain medication.   Day 1    Time  Name of Medication Number of pills taken  Amount of Acetaminophen  Pain Level   Comments  AM PM       AM PM       AM PM       AM PM       AM PM       AM PM       AM PM       AM PM       Total Daily amount of Acetaminophen Do not  take more than  3,000 mg per day      Day 2    Time  Name of Medication Number of pills taken  Amount of Acetaminophen  Pain Level   Comments  AM PM       AM PM       AM PM       AM PM       AM PM       AM PM       AM PM       AM PM       Total Daily amount of Acetaminophen Do not take more than  3,000 mg per day      Day 3    Time  Name of Medication Number of pills taken  Amount of Acetaminophen  Pain Level   Comments  AM PM       AM PM       AM PM       AM PM          AM PM       AM PM       AM PM       AM PM       Total Daily amount of Acetaminophen Do not take more than  3,000 mg per day      Day 4    Time  Name of Medication Number of pills taken  Amount of Acetaminophen  Pain Level   Comments  AM PM       AM PM       AM PM       AM PM       AM PM       AM PM       AM PM       AM PM       Total Daily amount of Acetaminophen Do not take more than  3,000 mg per day      Day 5    Time  Name of Medication Number of pills taken  Amount of Acetaminophen  Pain Level   Comments  AM PM       AM PM       AM   PM       AM PM       AM PM       AM PM       AM PM       AM PM       Total Daily amount of Acetaminophen Do not take more than  3,000 mg per day       Day 6    Time  Name of Medication Number of pills taken  Amount of Acetaminophen  Pain Level  Comments  AM PM       AM PM       AM PM       AM PM       AM PM       AM PM       AM PM       AM PM       Total Daily amount of Acetaminophen Do not take more than  3,000 mg per day      Day 7    Time  Name of Medication Number of pills taken  Amount of Acetaminophen  Pain Level   Comments  AM PM       AM PM       AM PM       AM PM       AM PM       AM PM       AM PM       AM PM       Total Daily amount of Acetaminophen Do not take more than  3,000 mg per day        For additional information about how and where to safely dispose of unused  opioid medications - https://www.morepowerfulnc.org  Disclaimer: This document contains information and/or instructional materials adapted from [facility] for the typical patient with your condition. It does not replace medical advice from your health care provider because your experience may differ from that of the typical patient. Talk to your health care provider if you have any questions about this document, your condition or your treatment plan. Adapted from [facility]   

## 2019-11-25 NOTE — Transfer of Care (Signed)
Immediate Anesthesia Transfer of Care Note  Patient: Judith Long  Procedure(s) Performed: LAPAROSCOPIC CHOLECYSTECTOMY (N/A Abdomen) ATTEMPED INTRAOPERATIVE CHOLANGIOGRAM (N/A Abdomen)  Patient Location: PACU  Anesthesia Type:General  Level of Consciousness: drowsy and patient cooperative  Airway & Oxygen Therapy: Patient Spontanous Breathing  Post-op Assessment: Report given to RN and Post -op Vital signs reviewed and stable  Post vital signs: Reviewed and stable  Last Vitals:  Vitals Value Taken Time  BP 138/63 11/25/19 1124  Temp    Pulse 74 11/25/19 1126  Resp 14 11/25/19 1126  SpO2 100 % 11/25/19 1126  Vitals shown include unvalidated device data.  Last Pain:  Vitals:   11/25/19 0750  TempSrc: Oral  PainSc: 0-No pain         Complications: No complications documented.

## 2019-11-25 NOTE — Anesthesia Procedure Notes (Signed)
Anesthesia Regional Block: TAP block   Pre-Anesthetic Checklist: ,, timeout performed, Correct Patient, Correct Site, Correct Laterality, Correct Procedure, Correct Position, site marked, Risks and benefits discussed,  Surgical consent,  Pre-op evaluation,  At surgeon's request and post-op pain management  Laterality: Right  Prep: chloraprep       Needles:  Injection technique: Single-shot  Needle Type: Echogenic Needle     Needle Length: 9cm  Needle Gauge: 21     Additional Needles:   Narrative:  Start time: 11/25/2019 9:57 AM End time: 11/25/2019 10:02 AM Injection made incrementally with aspirations every 5 mL.  Performed by: Personally  Anesthesiologist: Achille Rich, MD  Additional Notes: Pt tolerated the procedure well.

## 2019-11-25 NOTE — H&P (Signed)
49 y/o F with a PMH hypothyroidism, antiphospholipid antibody syndrome, and cholelithiasis who presented to the ED with a cc RUQ pain. Patient reports the pain started about 4 days ago - acute onset, non-radiating RUQ pain. Pain has gradually worsened. Associated sxs include fever, chills, nausea, and heartburn. Denies emesis. Reports clay colored stools and dark urine recently. Reports similar pain in the past, off and on for about 5 years, worse after eating.   Family hx: mom/brother with GB disease, father colon cancer, brother with inflammatory bowel disease PSH: Dx laparoscopy and bilateral salpingectomy Social history: occasional alcohol use; denies tobacco or drug use  ROS: Review of Systems  Constitutional: Positive for chills and fever.  Respiratory: Negative for shortness of breath and wheezing.   Cardiovascular: Negative for chest pain and palpitations.  Gastrointestinal: Positive for abdominal pain, heartburn and nausea. Negative for blood in stool, constipation, diarrhea, melena and vomiting.  Genitourinary: Negative for dysuria, frequency and urgency.       Dark urine  Skin: Positive for itching.  All other systems reviewed and are negative.        Family History  Problem Relation Age of Onset  . Addison's disease Mother   . Colon cancer Father   . Diabetes Maternal Grandmother         Past Medical History:  Diagnosis Date  . Antiphospholipid antibody syndrome (HCC) 2000  . Complication of anesthesia    slow to wake up. disoriented  . Gallstones 2016   noted on CT  . IBS (irritable bowel syndrome)    diagnosed in [place]  . Thyroid disease          Past Surgical History:  Procedure Laterality Date  . ANKLE SURGERY     ligaments repaired, done in [place]  . DILATATION & CURETTAGE/HYSTEROSCOPY WITH MYOSURE N/A 02/24/2017   Procedure: DILATATION & CURETTAGE/HYSTEROSCOPY;  Surgeon: Jerene Bears, MD;  Location: [facility];  Service: Gynecology;  Laterality: N/A;  . ECTOPIC PREGNANCY SURGERY  2000  . LAPAROSCOPY N/A 02/24/2017   Procedure: LAPAROSCOPY DIAGNOSTIC, LYSIS OF ADHESIONS, SALPINECTOMY;  Surgeon: Jerene Bears, MD;  Location: [facility];  Service: Gynecology;  Laterality: N/A;  . WISDOM TOOTH EXTRACTION      Social History:  reports that she has never smoked. She has never used smokeless tobacco. She reports current alcohol use of about 7.0 standard drinks of alcohol per week. She reports that she does not use drugs.  Allergies:  Allergies  Allergen Reactions  . Toradol [Ketorolac Tromethamine] Hives  . Aspirin Rash    fever    (Not in a hospital admission)   Blood pressure 124/81, pulse 94, temperature 97.8 F (36.6 C), temperature source Oral, resp. rate 16, height 5' (1.524 m), weight 72.6 kg, SpO2 98 %. Physical Exam: General: pleasant, WD, overweight white female who is laying in bed in NAD HEENT: Sclera are anicteric.  PERRL.  Ears and nose without any masses or lesions.  Mouth is pink and moist Heart: regular, rate, and rhythm.  Normal s1,s2. No obvious murmurs, gallops, or rubs noted.  Palpable radial and pedal pulses bilaterally Lungs: CTAB, no wheezes, rhonchi, or rales noted.  Respiratory effort nonlabored Abd: soft, mildly ttp in RUQ, ND, +BS, no masses, hernias, or organomegaly MS: all 4 extremities are symmetrical with no cyanosis, clubbing, or edema. Skin: warm and dry with no masses, lesions, or rashes; not jaundiced Neuro: Cranial nerves 2-12 grossly intact, sensation is normal throughout Psych:  A&Ox3 with an appropriate affect.   Lab Results Last 48 Hours        Results for orders placed or performed during the hospital encounter of 11/22/19 (from the past 48 hour(s))  Lipase, blood     Status: None   Collection Time: 11/22/19  7:07 PM  Result Value Ref Range   Lipase 47 11 - 51 U/L    Comment: Performed at [facility], [address], [place], [place] [address]  Comprehensive metabolic panel     Status: Abnormal   Collection Time: 11/22/19  7:07 PM  Result Value Ref Range   Sodium 139 135 - 145 mmol/L   Potassium 4.3 3.5 - 5.1 mmol/L   Chloride 103 98 - 111 mmol/L   CO2 23 22 - 32 mmol/L   Glucose, Bld 108 (H) 70 - 99 mg/dL    Comment: Glucose reference range applies only to samples taken after fasting for at least 8 hours.   BUN 6 6 - 20 mg/dL   Creatinine, Ser 2.37 0.44 - 1.00 mg/dL   Calcium 9.9 8.9 - 62.8 mg/dL   Total Protein 7.4 6.5 - 8.1 g/dL   Albumin 4.2 3.5 - 5.0 g/dL   AST 315 (H) 15 - 41 U/L   ALT 478 (H) 0 - 44 U/L   Alkaline Phosphatase 85 38 - 126 U/L   Total Bilirubin 4.4 (H) 0.3 - 1.2 mg/dL   GFR calc non Af Amer >60 >60 mL/min   GFR calc Af Amer >60 >60 mL/min   Anion gap 13 5 - 15    Comment: Performed at [facility], [address], [place], [place] [address]  CBC     Status: Abnormal   Collection Time: 11/22/19  7:07 PM  Result Value Ref Range   WBC 9.1 4.0 - 10.5 K/uL   RBC 4.98 3.87 - 5.11 MIL/uL   Hemoglobin 16.1 (H) 12.0 - 15.0 g/dL   HCT 07.3 (H) 36 - 46 %   MCV 95.0 80.0 - 100.0 fL   MCH 32.3 26.0 - 34.0 pg   MCHC 34.0 30.0 - 36.0 g/dL   RDW 71.0 62.6 - 94.8 %   Platelets 174 150 - 400 K/uL   nRBC 0.0 0.0 - 0.2 %    Comment: Performed at [facility], [address], [place], [place] [address]  Pregnancy, urine     Status: None   Collection Time: 11/23/19  8:08 AM  Result Value Ref Range   Preg Test, Ur NEGATIVE NEGATIVE    Comment:        THE SENSITIVITY OF THIS METHODOLOGY IS >20 mIU/mL. Performed at [facility], [address] [place], [place] [address]       Imaging Results (Last 48 hours)  US Abdomen Limited RUQ  Result Date: 11/23/2019 CLINICAL DATA:  Acute right upper quadrant abdominal pain. EXAM: ULTRASOUND ABDOMEN LIMITED RIGHT UPPER QUADRANT COMPARISON:  April 19, 2015.  FINDINGS: Gallbladder: Mild cholelithiasis is noted without gallbladder wall thickening or pericholecystic fluid. No sonographic Murphy's sign is noted. Common bile duct: Diameter: 5 mm which is within normal limits. Liver: No focal lesion identified. Increased echogenicity of hepatic parenchyma is noted suggesting hepatic steatosis. Portal vein is patent on color Doppler imaging with normal direction of blood flow towards the liver. Other: None. IMPRESSION: Mild cholelithiasis without evidence of cholecystitis. Probable hepatic steatosis. Electronically Signed   By: Zenda Alpers.D.  On: 11/23/2019 08:36     Assessment/Plan Choledocholithiasis -lap chole -I discussed the procedure in detail.  We discussed the risks and benefits of a laparoscopic cholecystectomy and possible cholangiogram including, but not limited to bleeding, infection, injury to surrounding structures such as the intestine or liver, bile leak, retained gallstones, need to convert to an open procedure, prolonged diarrhea, blood clots such as  DVT, common bile duct injury, anesthesia risks, and possible need for additional procedures.  The likelihood of improvement in symptoms and return to the patient's normal status is good. We discussed the typical post-operative recovery course.

## 2019-11-25 NOTE — Progress Notes (Signed)
Per Dr. Nicholaus Corolla, okay not to have urine pregnancy done today.  Patient has negative pregnancy test charted in epic on 11/23/19

## 2019-11-25 NOTE — Anesthesia Preprocedure Evaluation (Signed)
Anesthesia Evaluation  Patient identified by MRN, date of birth, ID band Patient awake    Reviewed: Allergy & Precautions, H&P , NPO status , Patient's Chart, lab work & pertinent test results  Airway Mallampati: II   Neck ROM: full    Dental   Pulmonary    breath sounds clear to auscultation       Cardiovascular negative cardio ROS   Rhythm:regular Rate:Normal     Neuro/Psych  Neuromuscular disease    GI/Hepatic IBS cholelithiasis   Endo/Other  Hypothyroidism obese  Renal/GU      Musculoskeletal   Abdominal   Peds  Hematology   Anesthesia Other Findings   Reproductive/Obstetrics                             Anesthesia Physical Anesthesia Plan  ASA: II  Anesthesia Plan: General   Post-op Pain Management:    Induction: Intravenous  PONV Risk Score and Plan: 3 and Ondansetron, Dexamethasone, Midazolam and Treatment may vary due to age or medical condition  Airway Management Planned: Oral ETT  Additional Equipment:   Intra-op Plan:   Post-operative Plan: Extubation in OR  Informed Consent: I have reviewed the patients History and Physical, chart, labs and discussed the procedure including the risks, benefits and alternatives for the proposed anesthesia with the patient or authorized representative who has indicated his/her understanding and acceptance.       Plan Discussed with: CRNA, Anesthesiologist and Surgeon  Anesthesia Plan Comments:         Anesthesia Quick Evaluation

## 2019-11-25 NOTE — Op Note (Signed)
Preoperative diagnosis: choledocholithiasis Postoperative diagnosis: same as above Procedure: laparoscopic cholecystectomy Surgeon: Dr Harden Mo Asst Myrtie Soman Anes: general  EBL: 30 cc Complications: none Drains none Specimens gallbladder and contents to pathology Sponge and needle count correct dispo to recovery stable.  Indications: 51 yof s/p resolved choledocholithiasis here for lap chole.   Procedure: After informed consent was obtained the patient was taken to the operating room.  She was given antibiotics.  SCDs were placed.  She was placed under general anesthesia without complication.  She was prepped and draped in the standard sterile surgical fashion.  A surgical timeout was then performed.  I infiltrated Marcaine below the umbilicus.  I made a vertical incision and grasped the fascia.  I entered the fascia and entered then entered the peritoneum bluntly.  There was no evidence of an entry injury.  I placed 2-0 Vicryl pursestring suture through the fascia.  I then inserted a Hassan trocar and insufflated the abdomen to 15 mmHg pressure.  I then inserted 3 further 5 mm trocars in epigastrium and right upper quadrant under direct vision without complication.    I then retracted it cephalad and lateral.  I did use the ICG dye as well as anatomical landmarks to identify the common bile duct and obtain the critical view of safety. I tried to do a cholangiogram and was unable to pass the catheter so I aborted it.   I then clipped the cystic duct 3 times.  The clips completely went across the duct and the duct was viable.  I treated the artery in a similar fashion.  I then removed the gallbladder from the liver bed.   I placed this in a retrieval bag and removed it from the umbilicus.  I then obtained hemostasis on the liver.  I did place one small piece of snow.  I then evacuated all the fluid.  I removed my Hassan trocar and tied my pursestring down.  I placed an additional 0  Vicryl suture using the suture passer device to completely obliterate this defect.  I then desufflated the abdomen and removed all the trocars.  I then closed these with 4-0 Monocryl and glue.  She tolerated this well was extubated and transferred to recovery stable.

## 2019-11-25 NOTE — Interval H&P Note (Signed)
History and Physical Interval Note:  11/25/2019 9:27 AM  Judith Long  has presented today for surgery, with the diagnosis of CHOLELITHIASIS.  The various methods of treatment have been discussed with the patient and family. After consideration of risks, benefits and other options for treatment, the patient has consented to  Procedure(s): LAPAROSCOPIC CHOLECYSTECTOMY WITH INTRAOPERATIVE CHOLANGIOGRAM (N/A) as a surgical intervention.  The patient's history has been reviewed, patient examined, no change in status, stable for surgery.  I have reviewed the patient's chart and labs.  Questions were answered to the patient's satisfaction.     Emelia Loron

## 2019-11-25 NOTE — Anesthesia Procedure Notes (Signed)
Procedure Name: Intubation Date/Time: 11/25/2019 10:20 AM Performed by: Rosiland Oz, CRNA Pre-anesthesia Checklist: Patient identified, Emergency Drugs available, Suction available, Patient being monitored and Timeout performed Patient Re-evaluated:Patient Re-evaluated prior to induction Oxygen Delivery Method: Circle system utilized Preoxygenation: Pre-oxygenation with 100% oxygen Induction Type: IV induction Ventilation: Mask ventilation without difficulty Laryngoscope Size: Miller and 3 Grade View: Grade I Tube type: Oral Tube size: 7.0 mm Number of attempts: 1 Airway Equipment and Method: Stylet Placement Confirmation: ETT inserted through vocal cords under direct vision,  positive ETCO2 and breath sounds checked- equal and bilateral Secured at: 20 cm Tube secured with: Tape Dental Injury: Teeth and Oropharynx as per pre-operative assessment

## 2019-11-25 NOTE — Anesthesia Procedure Notes (Signed)
Anesthesia Regional Block: TAP block   Pre-Anesthetic Checklist: ,, timeout performed, Correct Patient, Correct Site, Correct Laterality, Correct Procedure, Correct Position, site marked, Risks and benefits discussed,  Surgical consent,  Pre-op evaluation,  At surgeon's request and post-op pain management  Laterality: Left  Prep: chloraprep       Needles:  Injection technique: Single-shot  Needle Type: Echogenic Needle     Needle Length: 9cm  Needle Gauge: 21     Additional Needles:   Narrative:  Start time: 11/25/2019 9:48 AM End time: 11/25/2019 9:57 AM Injection made incrementally with aspirations every 5 mL.  Performed by: Personally  Anesthesiologist: Achille Rich, MD  Additional Notes: Pt tolerated the procedure well.

## 2019-11-26 LAB — SURGICAL PATHOLOGY

## 2019-11-26 NOTE — Anesthesia Postprocedure Evaluation (Signed)
Anesthesia Post Note  Patient: Judith Long  Procedure(s) Performed: LAPAROSCOPIC CHOLECYSTECTOMY (N/A Abdomen) ATTEMPED INTRAOPERATIVE CHOLANGIOGRAM (N/A Abdomen)     Patient location during evaluation: PACU Anesthesia Type: General Level of consciousness: awake and alert Pain management: pain level controlled Vital Signs Assessment: post-procedure vital signs reviewed and stable Respiratory status: spontaneous breathing, nonlabored ventilation, respiratory function stable and patient connected to nasal cannula oxygen Cardiovascular status: blood pressure returned to baseline and stable Postop Assessment: no apparent nausea or vomiting Anesthetic complications: no   No complications documented.  Last Vitals:  Vitals:   11/25/19 1225 11/25/19 1245  BP: 107/75 106/78  Pulse: (!) 59 61  Resp: 11 16  Temp: 36.5 C   SpO2: 97% 97%    Last Pain:  Vitals:   11/25/19 1225  TempSrc:   PainSc: Asleep                 Tahjir Silveria S

## 2019-12-03 DIAGNOSIS — Z9049 Acquired absence of other specified parts of digestive tract: Secondary | ICD-10-CM | POA: Diagnosis not present

## 2019-12-10 DIAGNOSIS — E039 Hypothyroidism, unspecified: Secondary | ICD-10-CM | POA: Diagnosis not present

## 2019-12-10 DIAGNOSIS — Z79899 Other long term (current) drug therapy: Secondary | ICD-10-CM | POA: Diagnosis not present

## 2019-12-10 DIAGNOSIS — E782 Mixed hyperlipidemia: Secondary | ICD-10-CM | POA: Diagnosis not present

## 2020-02-24 DIAGNOSIS — L237 Allergic contact dermatitis due to plants, except food: Secondary | ICD-10-CM | POA: Diagnosis not present

## 2020-06-14 DIAGNOSIS — E782 Mixed hyperlipidemia: Secondary | ICD-10-CM | POA: Diagnosis not present

## 2020-06-14 DIAGNOSIS — E039 Hypothyroidism, unspecified: Secondary | ICD-10-CM | POA: Diagnosis not present

## 2020-06-14 DIAGNOSIS — R030 Elevated blood-pressure reading, without diagnosis of hypertension: Secondary | ICD-10-CM | POA: Diagnosis not present

## 2020-12-01 DIAGNOSIS — E039 Hypothyroidism, unspecified: Secondary | ICD-10-CM | POA: Diagnosis not present

## 2020-12-01 DIAGNOSIS — E559 Vitamin D deficiency, unspecified: Secondary | ICD-10-CM | POA: Diagnosis not present

## 2020-12-01 DIAGNOSIS — E782 Mixed hyperlipidemia: Secondary | ICD-10-CM | POA: Diagnosis not present

## 2020-12-20 DIAGNOSIS — N281 Cyst of kidney, acquired: Secondary | ICD-10-CM | POA: Diagnosis not present

## 2020-12-20 DIAGNOSIS — K5730 Diverticulosis of large intestine without perforation or abscess without bleeding: Secondary | ICD-10-CM | POA: Diagnosis not present

## 2020-12-20 DIAGNOSIS — K862 Cyst of pancreas: Secondary | ICD-10-CM | POA: Diagnosis not present

## 2020-12-20 DIAGNOSIS — R945 Abnormal results of liver function studies: Secondary | ICD-10-CM | POA: Diagnosis not present

## 2020-12-20 DIAGNOSIS — K7689 Other specified diseases of liver: Secondary | ICD-10-CM | POA: Diagnosis not present

## 2021-05-23 DIAGNOSIS — E782 Mixed hyperlipidemia: Secondary | ICD-10-CM | POA: Diagnosis not present

## 2021-05-23 DIAGNOSIS — K760 Fatty (change of) liver, not elsewhere classified: Secondary | ICD-10-CM | POA: Diagnosis not present

## 2021-05-23 DIAGNOSIS — E039 Hypothyroidism, unspecified: Secondary | ICD-10-CM | POA: Diagnosis not present

## 2021-05-23 DIAGNOSIS — R030 Elevated blood-pressure reading, without diagnosis of hypertension: Secondary | ICD-10-CM | POA: Diagnosis not present

## 2021-05-24 DIAGNOSIS — R739 Hyperglycemia, unspecified: Secondary | ICD-10-CM | POA: Diagnosis not present

## 2021-08-22 DIAGNOSIS — I10 Essential (primary) hypertension: Secondary | ICD-10-CM | POA: Diagnosis not present

## 2021-10-31 DIAGNOSIS — I10 Essential (primary) hypertension: Secondary | ICD-10-CM | POA: Diagnosis not present

## 2022-04-17 IMAGING — US US ABDOMEN LIMITED
1 series · 14 of 25 positions shown · non-contrast
Comparison: April 19, 2015.

CLINICAL DATA: Acute right upper quadrant abdominal pain.

EXAM:
ULTRASOUND ABDOMEN LIMITED RIGHT UPPER QUADRANT

[Series 1: us abdomen limited · 14 of 34 slices shown]
[im 1/34]
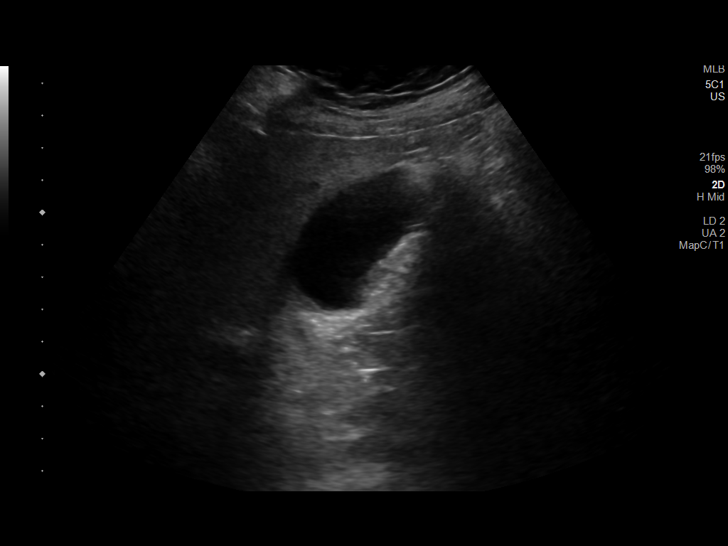
[im 3/34]
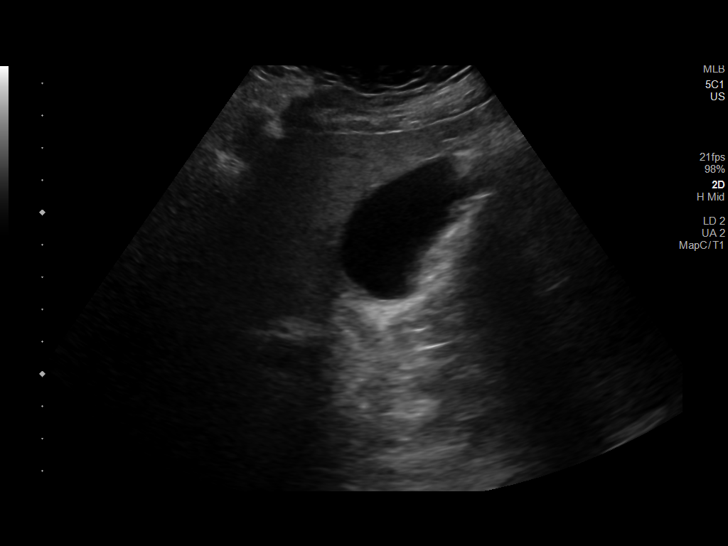
[im 6/34]
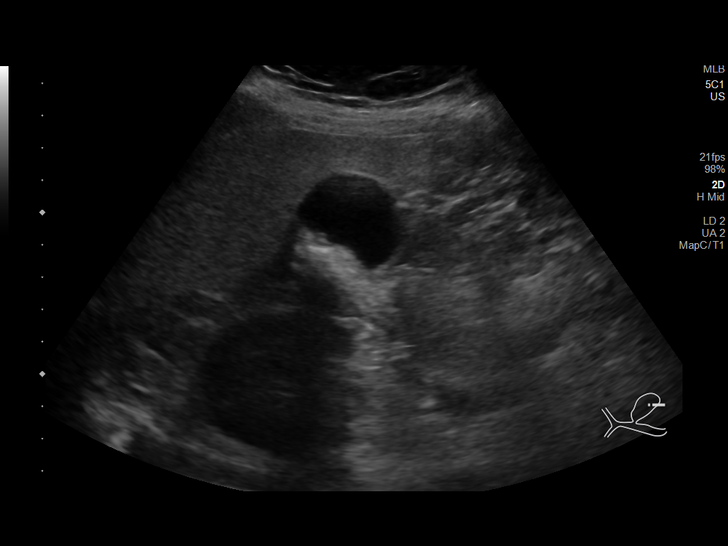
[im 9/34]
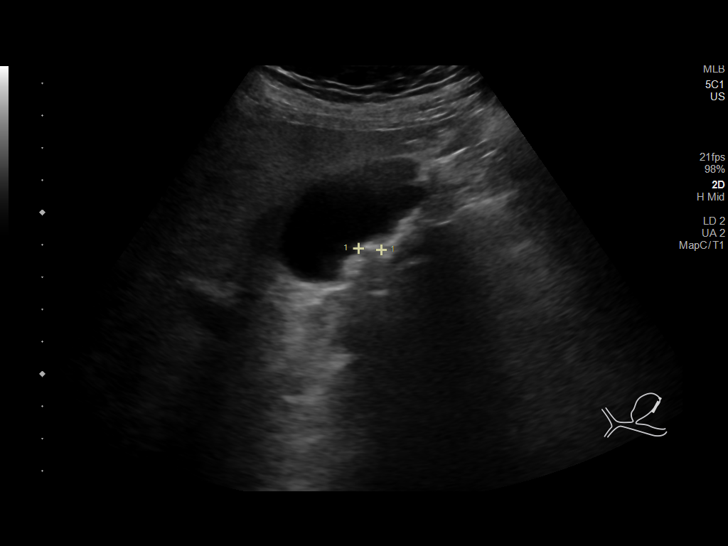
[im 12/34]
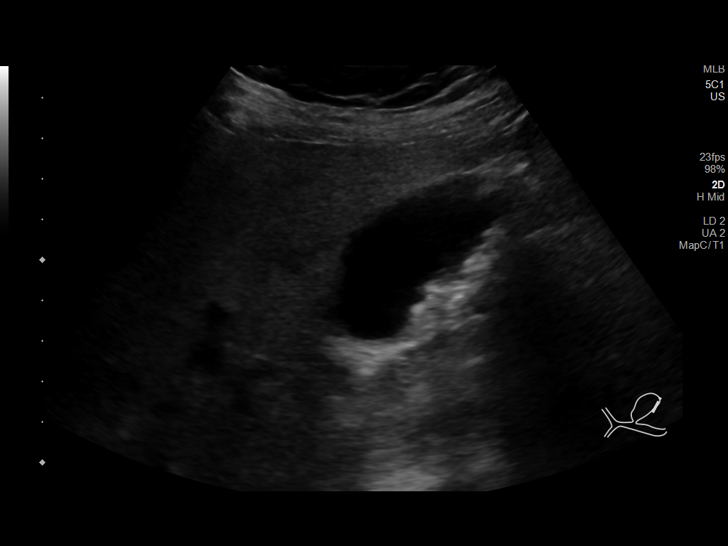
[im 13/34]
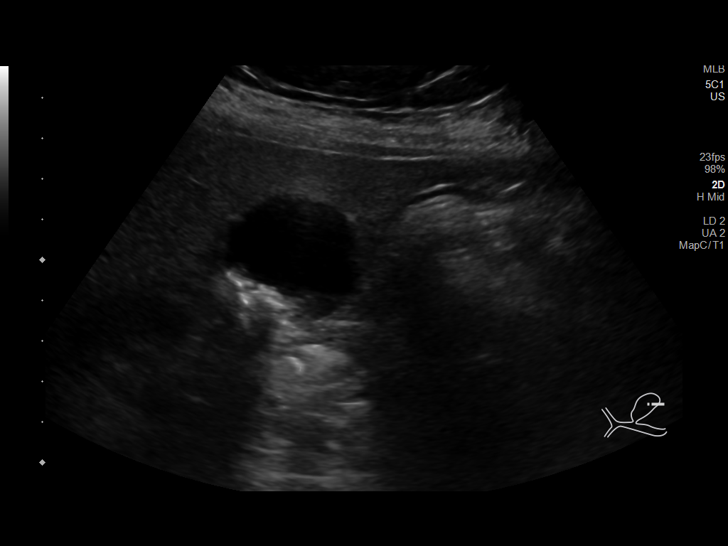
[im 16/34]
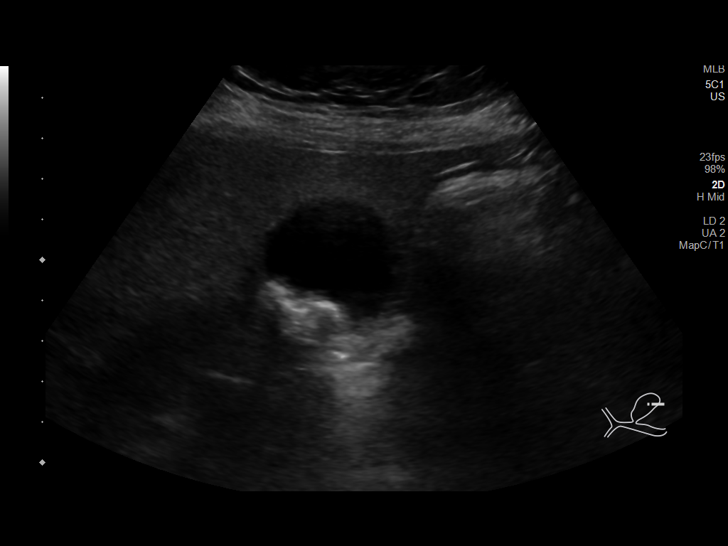
[im 18/34]
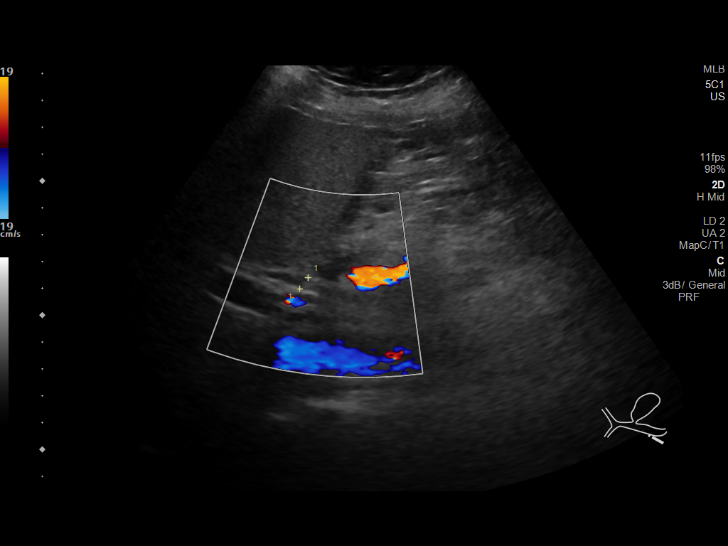
[im 21/34]
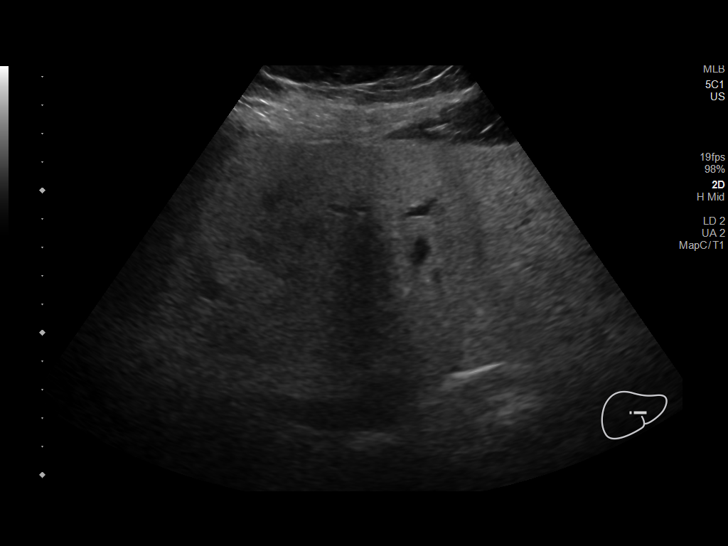
[im 23/34]
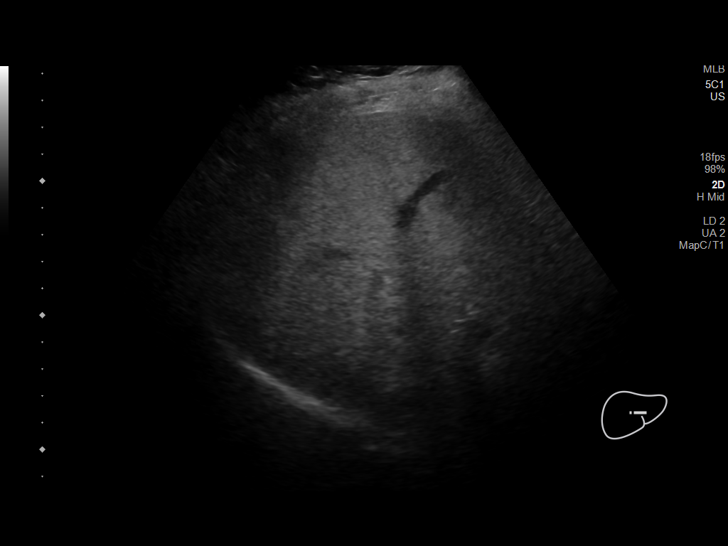
[im 25/34]
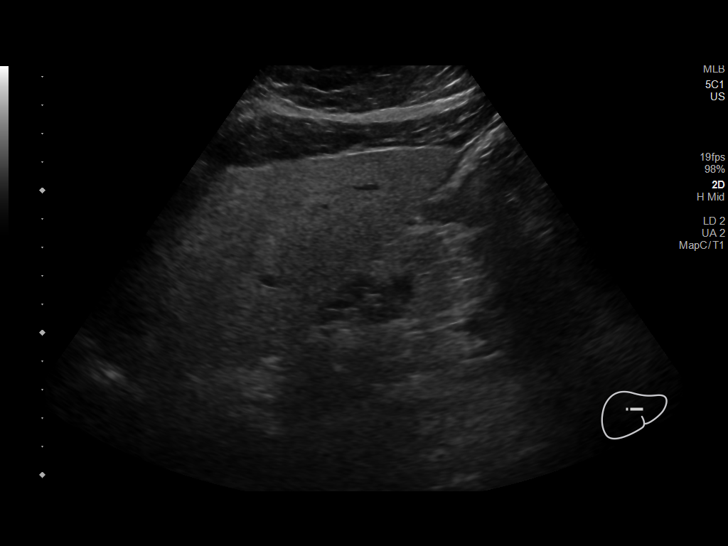
[im 28/34]
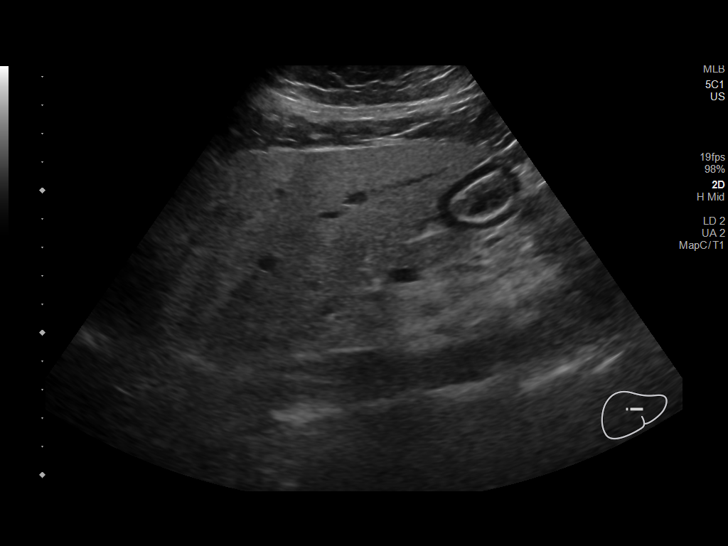
[im 31/34]
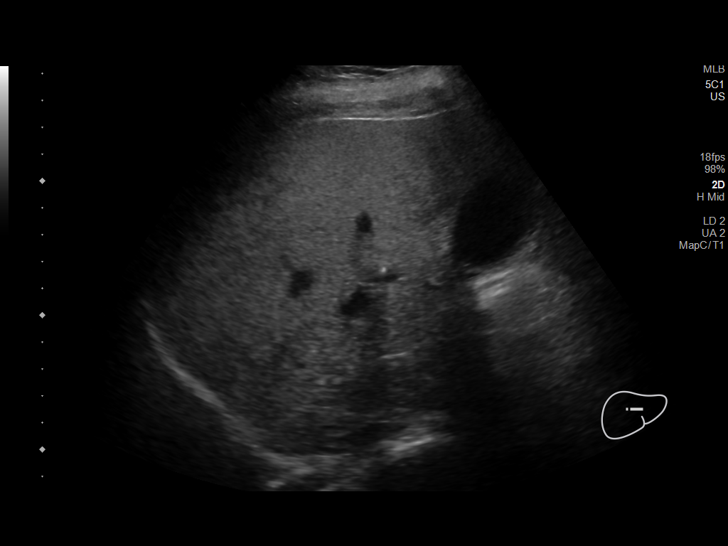
[im 34/34]
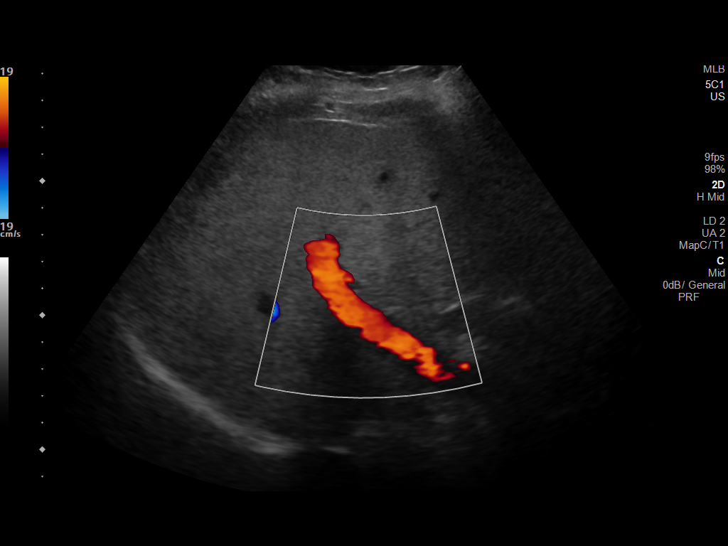

[14 of 25 positions shown; findings below may reference images not displayed]

FINDINGS: Gallbladder:

Mild cholelithiasis is noted without gallbladder wall thickening or
pericholecystic fluid. No sonographic Murphy's sign is noted.

Common bile duct:

Diameter: 5 mm which is within normal limits.

Liver:

No focal lesion identified. Increased echogenicity of hepatic
parenchyma is noted suggesting hepatic steatosis. Portal vein is
patent on color Doppler imaging with normal direction of blood flow
towards the liver.

Other: None.
IMPRESSION: Mild cholelithiasis without evidence of cholecystitis.

Probable hepatic steatosis.

## 2022-05-07 DIAGNOSIS — I10 Essential (primary) hypertension: Secondary | ICD-10-CM | POA: Diagnosis not present

## 2022-05-07 DIAGNOSIS — E039 Hypothyroidism, unspecified: Secondary | ICD-10-CM | POA: Diagnosis not present

## 2022-05-07 DIAGNOSIS — E782 Mixed hyperlipidemia: Secondary | ICD-10-CM | POA: Diagnosis not present

## 2022-05-07 DIAGNOSIS — K760 Fatty (change of) liver, not elsewhere classified: Secondary | ICD-10-CM | POA: Diagnosis not present

## 2022-06-18 DIAGNOSIS — I10 Essential (primary) hypertension: Secondary | ICD-10-CM | POA: Diagnosis not present

## 2022-06-18 DIAGNOSIS — E039 Hypothyroidism, unspecified: Secondary | ICD-10-CM | POA: Diagnosis not present

## 2022-06-18 DIAGNOSIS — K760 Fatty (change of) liver, not elsewhere classified: Secondary | ICD-10-CM | POA: Diagnosis not present

## 2022-12-20 DIAGNOSIS — E039 Hypothyroidism, unspecified: Secondary | ICD-10-CM | POA: Diagnosis not present

## 2022-12-20 DIAGNOSIS — R748 Abnormal levels of other serum enzymes: Secondary | ICD-10-CM | POA: Diagnosis not present

## 2022-12-20 DIAGNOSIS — E782 Mixed hyperlipidemia: Secondary | ICD-10-CM | POA: Diagnosis not present

## 2022-12-20 DIAGNOSIS — I10 Essential (primary) hypertension: Secondary | ICD-10-CM | POA: Diagnosis not present

## 2023-06-20 DIAGNOSIS — E782 Mixed hyperlipidemia: Secondary | ICD-10-CM | POA: Diagnosis not present

## 2023-06-20 DIAGNOSIS — I10 Essential (primary) hypertension: Secondary | ICD-10-CM | POA: Diagnosis not present

## 2023-06-20 DIAGNOSIS — E039 Hypothyroidism, unspecified: Secondary | ICD-10-CM | POA: Diagnosis not present

## 2023-06-20 DIAGNOSIS — E559 Vitamin D deficiency, unspecified: Secondary | ICD-10-CM | POA: Diagnosis not present

## 2023-06-24 DIAGNOSIS — R7309 Other abnormal glucose: Secondary | ICD-10-CM | POA: Diagnosis not present

## 2023-12-19 DIAGNOSIS — E782 Mixed hyperlipidemia: Secondary | ICD-10-CM | POA: Diagnosis not present

## 2023-12-19 DIAGNOSIS — I10 Essential (primary) hypertension: Secondary | ICD-10-CM | POA: Diagnosis not present

## 2023-12-19 DIAGNOSIS — E039 Hypothyroidism, unspecified: Secondary | ICD-10-CM | POA: Diagnosis not present

## 2023-12-19 DIAGNOSIS — D6861 Antiphospholipid syndrome: Secondary | ICD-10-CM | POA: Diagnosis not present

## 2024-01-09 DIAGNOSIS — E876 Hypokalemia: Secondary | ICD-10-CM | POA: Diagnosis not present

## 2024-02-23 DIAGNOSIS — E871 Hypo-osmolality and hyponatremia: Secondary | ICD-10-CM | POA: Diagnosis not present

## 2024-02-23 DIAGNOSIS — R053 Chronic cough: Secondary | ICD-10-CM | POA: Diagnosis not present

## 2024-02-23 DIAGNOSIS — Z6825 Body mass index (BMI) 25.0-25.9, adult: Secondary | ICD-10-CM | POA: Diagnosis not present

## 2024-02-23 DIAGNOSIS — J45909 Unspecified asthma, uncomplicated: Secondary | ICD-10-CM | POA: Diagnosis not present

## 2024-03-03 DIAGNOSIS — E876 Hypokalemia: Secondary | ICD-10-CM | POA: Diagnosis not present

## 2024-03-03 DIAGNOSIS — E039 Hypothyroidism, unspecified: Secondary | ICD-10-CM | POA: Diagnosis not present

## 2024-03-03 DIAGNOSIS — E669 Obesity, unspecified: Secondary | ICD-10-CM | POA: Diagnosis not present

## 2024-03-03 DIAGNOSIS — E871 Hypo-osmolality and hyponatremia: Secondary | ICD-10-CM | POA: Diagnosis not present

## 2024-03-05 DIAGNOSIS — E871 Hypo-osmolality and hyponatremia: Secondary | ICD-10-CM | POA: Diagnosis not present

## 2024-03-05 DIAGNOSIS — E876 Hypokalemia: Secondary | ICD-10-CM | POA: Diagnosis not present

## 2024-05-22 ENCOUNTER — Emergency Department (HOSPITAL_BASED_OUTPATIENT_CLINIC_OR_DEPARTMENT_OTHER)

## 2024-05-22 ENCOUNTER — Emergency Department (HOSPITAL_BASED_OUTPATIENT_CLINIC_OR_DEPARTMENT_OTHER)
Admission: EM | Admit: 2024-05-22 | Discharge: 2024-05-22 | Disposition: A | Discharge status: Home / Self Care | Attending: Emergency Medicine | Admitting: Emergency Medicine

## 2024-05-22 DIAGNOSIS — R000 Tachycardia, unspecified: Secondary | ICD-10-CM | POA: Diagnosis not present

## 2024-05-22 DIAGNOSIS — K5730 Diverticulosis of large intestine without perforation or abscess without bleeding: Secondary | ICD-10-CM | POA: Diagnosis not present

## 2024-05-22 DIAGNOSIS — R112 Nausea with vomiting, unspecified: Secondary | ICD-10-CM | POA: Insufficient documentation

## 2024-05-22 DIAGNOSIS — R109 Unspecified abdominal pain: Secondary | ICD-10-CM | POA: Diagnosis not present

## 2024-05-22 DIAGNOSIS — D251 Intramural leiomyoma of uterus: Secondary | ICD-10-CM | POA: Diagnosis not present

## 2024-05-22 LAB — COMPREHENSIVE METABOLIC PANEL WITH GFR
ALT: 41 U/L (ref 0–44)
AST: 87 U/L — ABNORMAL HIGH (ref 15–41)
Albumin: 4.9 g/dL (ref 3.5–5.0)
Alkaline Phosphatase: 59 U/L (ref 38–126)
Anion gap: 28 — ABNORMAL HIGH (ref 5–15)
BUN: 12 mg/dL (ref 6–20)
CO2: 15 mmol/L — ABNORMAL LOW (ref 22–32)
Calcium: 10 mg/dL (ref 8.9–10.3)
Chloride: 93 mmol/L — ABNORMAL LOW (ref 98–111)
Creatinine, Ser: 0.94 mg/dL (ref 0.44–1.00)
GFR, Estimated: >60 mL/min (ref >60)
Glucose, Bld: 181 mg/dL — ABNORMAL HIGH (ref 70–99)
Potassium: 4.7 mmol/L (ref 3.5–5.1)
Sodium: 136 mmol/L (ref 135–145)
Total Bilirubin: 2.5 mg/dL — ABNORMAL HIGH (ref 0.0–1.2)
Total Protein: 8.5 g/dL — ABNORMAL HIGH (ref 6.5–8.1)

## 2024-05-22 LAB — URINALYSIS, ROUTINE W REFLEX MICROSCOPIC
Bacteria, UA: NONE SEEN
Bilirubin Urine: NEGATIVE
Glucose, UA: NEGATIVE mg/dL
Hgb urine dipstick: NEGATIVE
Ketones, ur: >80 mg/dL — AB
Leukocytes,Ua: NEGATIVE
Nitrite: NEGATIVE
Protein, ur: 30 mg/dL — AB
Specific Gravity, Urine: >1.046 — ABNORMAL HIGH (ref 1.005–1.030)
pH: 6 (ref 5.0–8.0)

## 2024-05-22 LAB — CBC WITH DIFFERENTIAL/PLATELET
Abs Immature Granulocytes: 0.1 K/uL — ABNORMAL HIGH (ref 0.00–0.07)
Basophils Absolute: 0 K/uL (ref 0.0–0.1)
Basophils Relative: 0 %
Eosinophils Absolute: 0 K/uL (ref 0.0–0.5)
Eosinophils Relative: 0 %
HCT: 41.5 % (ref 36.0–46.0)
Hemoglobin: 14.9 g/dL (ref 12.0–15.0)
Immature Granulocytes: 1 %
Lymphocytes Relative: 4 %
Lymphs Abs: 0.6 K/uL — ABNORMAL LOW (ref 0.7–4.0)
MCH: 38.5 pg — ABNORMAL HIGH (ref 26.0–34.0)
MCHC: 35.9 g/dL (ref 30.0–36.0)
MCV: 107.2 fL — ABNORMAL HIGH (ref 80.0–100.0)
Monocytes Absolute: 0.7 K/uL (ref 0.1–1.0)
Monocytes Relative: 4 %
Neutro Abs: 14.8 K/uL — ABNORMAL HIGH (ref 1.7–7.7)
Neutrophils Relative %: 91 %
Platelets: 200 K/uL (ref 150–400)
RBC: 3.87 MIL/uL (ref 3.87–5.11)
RDW: 12 % (ref 11.5–15.5)
WBC: 16.3 K/uL — ABNORMAL HIGH (ref 4.0–10.5)
nRBC: 0 % (ref 0.0–0.2)

## 2024-05-22 LAB — LIPASE, BLOOD: Lipase: 104 U/L — ABNORMAL HIGH (ref 11–51)

## 2024-05-22 LAB — HCG, SERUM, QUALITATIVE: Preg, Serum: NEGATIVE

## 2024-05-22 MED ORDER — SODIUM CHLORIDE 0.9 % IV BOLUS
1000.0000 mL | Freq: Once | INTRAVENOUS | Status: AC
  Administered 2024-05-22: 1000 mL via INTRAVENOUS

## 2024-05-22 MED ORDER — IOHEXOL 300 MG/ML  SOLN
100.0000 mL | Freq: Once | INTRAMUSCULAR | Status: AC | PRN
  Administered 2024-05-22: 100 mL via INTRAVENOUS

## 2024-05-22 MED ORDER — ONDANSETRON 4 MG PO TBDP: ORAL_TABLET | ORAL | 0 refills | Status: AC

## 2024-05-22 MED ORDER — DIPHENHYDRAMINE HCL 50 MG/ML IJ SOLN
25.0000 mg | Freq: Once | INTRAMUSCULAR | Status: AC
  Administered 2024-05-22: 25 mg via INTRAVENOUS
  Filled 2024-05-22: qty 1

## 2024-05-22 MED ORDER — METOCLOPRAMIDE HCL 5 MG/ML IJ SOLN
10.0000 mg | Freq: Once | INTRAMUSCULAR | Status: AC
  Administered 2024-05-22: 10 mg via INTRAVENOUS
  Filled 2024-05-22: qty 2

## 2024-05-22 MED ORDER — ONDANSETRON 4 MG PO TBDP
4.0000 mg | ORAL_TABLET | Freq: Once | ORAL | Status: AC
  Administered 2024-05-22: 4 mg via ORAL
  Filled 2024-05-22: qty 1

## 2024-05-22 NOTE — ED Triage Notes (Signed)
 Reports n/v x 4 days. Denies abd pain. Headache. States hx of similar episodes for 2 years.

## 2024-05-22 NOTE — ED Provider Notes (Signed)
 [place] [facility]PARTMENT AT [facility] Provider Note   CSN: [id number] Arrival date & time: 05/22/24  9087     Patient presents with: Emesis   Judith Long is a 53 y.o. female.   53 yo F with a chief complaints of nausea and vomiting.  Going on for about 4 days now.  She tells me that she has been struggling with recurrent vomiting has been going on for some time.  Has been much more severe recently and much more persistent.  Has a headache with this vomiting was on the right side but on the left side and then felt like she was too weak to use her legs for a period of time.  She tells me she has seen endocrinology for this.  Denies abdominal pain denies fevers denies sick contacts.  Patient tells me she has not left her house in about 10 days.  She describes some green to her emesis today.  Did have a bowel movement this morning.  Denies diarrhea.   Emesis      Prior to Admission medications  Medication Sig Start Date End Date Taking? Authorizing Provider  ondansetron  (ZOFRAN -ODT) 4 MG disintegrating tablet 4mg  ODT q4 hours prn nausea/vomit 05/22/24  Yes Jakori Burkett, DO  Cholecalciferol (VITAMIN D ) 2000 UNITS tablet Take 2,000 Units by mouth 2 (two) times daily.     [provider]  dicyclomine (BENTYL) 10 MG capsule Take 10 mg by mouth 3 (three) times daily as needed for spasms.     [provider]  IBUPROFEN  PO Take 200 mg by mouth every 6 (six) hours as needed (pain).     [provider]  levothyroxine  (SYNTHROID , LEVOTHROID) 100 MCG tablet Take 100 mcg by mouth daily before breakfast.    [provider]  oxyCODONE  (OXY IR/ROXICODONE ) 5 MG immediate release tablet Take 1 tablet (5 mg total) by mouth every 6 (six) hours as needed for moderate pain, severe pain or breakthrough pain. 11/25/19   Ebbie Cough, MD  rosuvastatin  (CRESTOR ) 5 MG tablet Take 5 mg by mouth 3 (three) times a week. Pablo Heidelberg, Friday 11/17/19    [provider]    Allergies: Toradol [ketorolac tromethamine] and Aspirin    Review of Systems  Gastrointestinal:  Positive for vomiting.    Updated Vital Signs BP 110/70   Pulse 92   Temp 98.6 F (37 C) (Oral)   Resp 17   SpO2 100%   Physical Exam Vitals and nursing note reviewed.  Constitutional:      General: She is not in acute distress.    Appearance: She is well-developed. She is not diaphoretic.  HENT:     Head: Normocephalic and atraumatic.  Eyes:     Pupils: Pupils are equal, round, and reactive to light.  Cardiovascular:     Rate and Rhythm: Regular rhythm. Tachycardia present.     Heart sounds: No murmur heard.    No friction rub. No gallop.  Pulmonary:     Effort: Pulmonary effort is normal.     Breath sounds: No wheezing or rales.  Abdominal:     General: There is no distension.     Palpations: Abdomen is soft.     Tenderness: There is no abdominal tenderness.  Musculoskeletal:        General: No tenderness.     Cervical back: Normal range of motion and neck supple.  Skin:    General: Skin is warm and dry.  Neurological:  Mental Status: She is alert and oriented to person, place, and time.  Psychiatric:        Behavior: Behavior normal.     (all labs ordered are listed, but only abnormal results are displayed) Labs Reviewed  CBC WITH DIFFERENTIAL/PLATELET - Abnormal; Notable for the following components:      Result Value   WBC 16.3 (*)    MCV 107.2 (*)    MCH 38.5 (*)    Neutro Abs 14.8 (*)    Lymphs Abs 0.6 (*)    Abs Immature Granulocytes 0.10 (*)    All other components within normal limits  COMPREHENSIVE METABOLIC PANEL WITH GFR - Abnormal; Notable for the following components:   Chloride 93 (*)    CO2 15 (*)    Glucose, Bld 181 (*)    Total Protein 8.5 (*)    AST 87 (*)    Total Bilirubin 2.5 (*)    Anion gap 28 (*)    All other components within normal limits  LIPASE, BLOOD - Abnormal; Notable for the following  components:   Lipase 104 (*)    All other components within normal limits  URINALYSIS, ROUTINE W REFLEX MICROSCOPIC - Abnormal; Notable for the following components:   Specific Gravity, Urine >1.046 (*)    Ketones, ur >80 (*)    Protein, ur 30 (*)    All other components within normal limits  HCG, SERUM, QUALITATIVE    EKG: EKG Interpretation Date/Time:  Saturday May 22 2024 09:24:49 EST Ventricular Rate:  110 PR Interval:  139 QRS Duration:  106 QT Interval:  341 QTC Calculation: 462 R Axis:   76  Text Interpretation: Sinus tachycardia Low voltage, precordial leads Borderline T abnormalities, anterior leads Baseline wander No significant change since last tracing Confirmed by Emil Share [phone]) on 05/22/2024 9:26:04 AM  Radiology: CT ABDOMEN PELVIS W CONTRAST Result Date: 05/22/2024 EXAM: CT ABDOMEN AND PELVIS WITH CONTRAST 05/22/2024 10:39:57 AM TECHNIQUE: CT of the abdomen and pelvis was performed with the administration of intravenous contrast. Multiplanar reformatted images are provided for review. Automated exposure control, iterative reconstruction, and/or weight-based adjustment of the mA/kV was utilized to reduce the radiation dose to as low as reasonably achievable. COMPARISON: CT abdomen and pelvis 04/19/2015. CLINICAL HISTORY: 53 year old female with acute, nonlocalized abdominal pain. FINDINGS: LOWER CHEST: No abnormality. LIVER: Hepatic steatosis is new or progressed since the previous CT. GALLBLADDER AND BILE DUCTS: Gallbladder is surgically absent. No biliary ductal dilatation. SPLEEN: No acute abnormality. PANCREAS: No acute abnormality. ADRENAL GLANDS: No acute abnormality. KIDNEYS, URETERS AND BLADDER: No stones in the kidneys or ureters. No hydronephrosis. No perinephric or periureteral stranding. Urinary bladder is unremarkable. GI AND BOWEL: Nondilated stomach and small bowel. Decompressed large bowel throughout the abdomen and pelvis. Diverticulosis in the  transverse, descending and sigmoid colon segments. No active inflammation identified. Normal gas containing appendix on series 2 image 52. There is no bowel obstruction. PERITONEUM AND RETROPERITONEUM: No ascites. No free air. VASCULATURE: Major arterial structures and the portal venous system in the abdomen and pelvis are patent. There is mild calcified atherosclerosis of the distal abdominal aorta. LYMPH NODES: No lymphadenopathy. REPRODUCTIVE ORGANS: Small uterine fundus intramural fibroid 2.5 cm. BONES AND SOFT TISSUES: No acute osseous abnormality. No focal soft tissue abnormality. Incidental pelvic phleboliths. No convincing active inflammation. IMPRESSION: 1. Widespread large bowel diverticulosis. No convincing active inflammation. 2. Hepatic steatosis.  Small uterine fibroid. Electronically signed by: Helayne Hurst MD 05/22/2024 11:03 AM EST RP Workstation: [id number]  Procedures   Medications Ordered in the ED  sodium chloride  0.9 % bolus 1,000 mL (0 mLs Intravenous Stopped 05/22/24 1140)  metoCLOPramide  (REGLAN ) injection 10 mg (10 mg Intravenous Given 05/22/24 0950)  diphenhydrAMINE  (BENADRYL ) injection 25 mg (25 mg Intravenous Given 05/22/24 0948)  iohexol  (OMNIPAQUE ) 300 MG/ML solution 100 mL (100 mLs Intravenous Contrast Given 05/22/24 1034)  sodium chloride  0.9 % bolus 1,000 mL (0 mLs Intravenous Stopped 05/22/24 1318)  ondansetron  (ZOFRAN -ODT) disintegrating tablet 4 mg (4 mg Oral Given 05/22/24 1211)                                    Medical Decision Making Amount and/or Complexity of Data Reviewed Labs: ordered. Radiology: ordered.  Risk Prescription drug management.   53 yo F with a chief complaints of nausea vomiting going on for about 4 days.  Reportedly this is a recurrent problem for her though much worse in severity and lasting longer than typical.  She did describe some greenish vomit as well as CT.  Treat symptoms IV fluids lab work reassess.  CT imaging  without obvious acute intra-abdominal pathology.  Patient's metabolic acidosis with anion gap.  Ketones greater than 80.  Based on history likely ketoacidosis.  Patient feeling much better after liter of IV fluids and antiemetics.  Initially requesting to go home.  Was given a second liter of IV fluids with continued improvement.  Tolerated by mouth.  PCP follow-up.  1:30 PM:  I have discussed the diagnosis/risks/treatment options with the patient.  Evaluation and diagnostic testing in the emergency department does not suggest an emergent condition requiring admission or immediate intervention beyond what has been performed at this time.  They will follow up with PCP. We also discussed returning to the ED immediately if new or worsening sx occur. We discussed the sx which are most concerning (e.g., sudden worsening pain, fever, inability to tolerate by mouth) that necessitate immediate return. Medications administered to the patient during their visit and any new prescriptions provided to the patient are listed below.  Medications given during this visit Medications  sodium chloride  0.9 % bolus 1,000 mL (0 mLs Intravenous Stopped 05/22/24 1140)  metoCLOPramide  (REGLAN ) injection 10 mg (10 mg Intravenous Given 05/22/24 0950)  diphenhydrAMINE  (BENADRYL ) injection 25 mg (25 mg Intravenous Given 05/22/24 0948)  iohexol  (OMNIPAQUE ) 300 MG/ML solution 100 mL (100 mLs Intravenous Contrast Given 05/22/24 1034)  sodium chloride  0.9 % bolus 1,000 mL (0 mLs Intravenous Stopped 05/22/24 1318)  ondansetron  (ZOFRAN -ODT) disintegrating tablet 4 mg (4 mg Oral Given 05/22/24 1211)     The patient appears reasonably screen and/or stabilized for discharge and I doubt any other medical condition or other [facility] requiring further screening, evaluation, or treatment in the ED at this time prior to discharge.       Final diagnoses:  Nausea and vomiting in adult    ED Discharge Orders          Ordered    ondansetron   (ZOFRAN -ODT) 4 MG disintegrating tablet        05/22/24 1326               Emil Share, DO 05/22/24 1330

## 2024-05-22 NOTE — Discharge Instructions (Addendum)
 Please follow-up with your family doctor in the office.  Return for inability eat or drink.  Go [facility].

## 2024-05-22 NOTE — ED Notes (Signed)
 Pt d/c instructions, medications, and follow-up care reviewed with pt. Pt verbalized understanding and had no further questions at time of d/c. Pt CA&Ox4, ambulatory, and in NAD at time of d/c
